# Patient Record
Sex: Female | Born: 1960 | Race: White | Hispanic: No | Smoking: Current every day smoker
Health system: Southern US, Community
[De-identification: ages and names within clinical notes are randomized; demographics above are authoritative.]

## PROBLEM LIST (undated history)

## (undated) DIAGNOSIS — Z982 Presence of cerebrospinal fluid drainage device: Secondary | ICD-10-CM

## (undated) DIAGNOSIS — R569 Unspecified convulsions: Secondary | ICD-10-CM

## (undated) DIAGNOSIS — S069X9A Unspecified intracranial injury with loss of consciousness of unspecified duration, initial encounter: Secondary | ICD-10-CM

## (undated) DIAGNOSIS — Z930 Tracheostomy status: Secondary | ICD-10-CM

## (undated) DIAGNOSIS — I639 Cerebral infarction, unspecified: Secondary | ICD-10-CM

## (undated) DIAGNOSIS — Z931 Gastrostomy status: Secondary | ICD-10-CM

## (undated) DIAGNOSIS — G919 Hydrocephalus, unspecified: Secondary | ICD-10-CM

## (undated) DIAGNOSIS — N390 Urinary tract infection, site not specified: Secondary | ICD-10-CM

## (undated) DIAGNOSIS — I480 Paroxysmal atrial fibrillation: Secondary | ICD-10-CM

---

## 2014-06-29 ENCOUNTER — Encounter (HOSPITAL_COMMUNITY): Payer: Self-pay | Admitting: Adult Health

## 2014-06-29 ENCOUNTER — Emergency Department (HOSPITAL_COMMUNITY): Payer: No Typology Code available for payment source

## 2014-06-29 ENCOUNTER — Inpatient Hospital Stay (HOSPITAL_COMMUNITY): Payer: No Typology Code available for payment source

## 2014-06-29 ENCOUNTER — Inpatient Hospital Stay (HOSPITAL_COMMUNITY)
Admission: EM | Admit: 2014-06-29 | Discharge: 2014-07-29 | DRG: 003 | Disposition: A | Discharge status: Rehabilitation Facility | Payer: No Typology Code available for payment source | Attending: General Surgery | Admitting: General Surgery

## 2014-06-29 DIAGNOSIS — S066X9A Traumatic subarachnoid hemorrhage with loss of consciousness of unspecified duration, initial encounter: Secondary | ICD-10-CM | POA: Diagnosis present

## 2014-06-29 DIAGNOSIS — B955 Unspecified streptococcus as the cause of diseases classified elsewhere: Secondary | ICD-10-CM | POA: Diagnosis not present

## 2014-06-29 DIAGNOSIS — Y95 Nosocomial condition: Secondary | ICD-10-CM | POA: Diagnosis not present

## 2014-06-29 DIAGNOSIS — R0603 Acute respiratory distress: Secondary | ICD-10-CM

## 2014-06-29 DIAGNOSIS — Z4659 Encounter for fitting and adjustment of other gastrointestinal appliance and device: Secondary | ICD-10-CM

## 2014-06-29 DIAGNOSIS — R339 Retention of urine, unspecified: Secondary | ICD-10-CM | POA: Diagnosis not present

## 2014-06-29 DIAGNOSIS — S069X9A Unspecified intracranial injury with loss of consciousness of unspecified duration, initial encounter: Principal | ICD-10-CM | POA: Diagnosis present

## 2014-06-29 DIAGNOSIS — S2241XA Multiple fractures of ribs, right side, initial encounter for closed fracture: Secondary | ICD-10-CM

## 2014-06-29 DIAGNOSIS — J982 Interstitial emphysema: Secondary | ICD-10-CM | POA: Diagnosis present

## 2014-06-29 DIAGNOSIS — S069XAA Unspecified intracranial injury with loss of consciousness status unknown, initial encounter: Secondary | ICD-10-CM | POA: Diagnosis present

## 2014-06-29 DIAGNOSIS — T1490XA Injury, unspecified, initial encounter: Secondary | ICD-10-CM

## 2014-06-29 DIAGNOSIS — S02402A Zygomatic fracture, unspecified, initial encounter for closed fracture: Secondary | ICD-10-CM | POA: Diagnosis present

## 2014-06-29 DIAGNOSIS — N390 Urinary tract infection, site not specified: Secondary | ICD-10-CM | POA: Diagnosis not present

## 2014-06-29 DIAGNOSIS — B9620 Unspecified Escherichia coli [E. coli] as the cause of diseases classified elsewhere: Secondary | ICD-10-CM | POA: Diagnosis not present

## 2014-06-29 DIAGNOSIS — S0291XA Unspecified fracture of skull, initial encounter for closed fracture: Secondary | ICD-10-CM | POA: Diagnosis present

## 2014-06-29 DIAGNOSIS — Y92488 Other paved roadways as the place of occurrence of the external cause: Secondary | ICD-10-CM | POA: Diagnosis not present

## 2014-06-29 DIAGNOSIS — Z6831 Body mass index (BMI) 31.0-31.9, adult: Secondary | ICD-10-CM | POA: Diagnosis not present

## 2014-06-29 DIAGNOSIS — J189 Pneumonia, unspecified organism: Secondary | ICD-10-CM | POA: Diagnosis not present

## 2014-06-29 DIAGNOSIS — Z9289 Personal history of other medical treatment: Secondary | ICD-10-CM

## 2014-06-29 DIAGNOSIS — I639 Cerebral infarction, unspecified: Secondary | ICD-10-CM | POA: Diagnosis not present

## 2014-06-29 DIAGNOSIS — S0292XA Unspecified fracture of facial bones, initial encounter for closed fracture: Secondary | ICD-10-CM | POA: Diagnosis present

## 2014-06-29 DIAGNOSIS — G40209 Localization-related (focal) (partial) symptomatic epilepsy and epileptic syndromes with complex partial seizures, not intractable, without status epilepticus: Secondary | ICD-10-CM | POA: Diagnosis present

## 2014-06-29 DIAGNOSIS — S60512A Abrasion of left hand, initial encounter: Secondary | ICD-10-CM | POA: Diagnosis present

## 2014-06-29 DIAGNOSIS — S12601A Unspecified nondisplaced fracture of seventh cervical vertebra, initial encounter for closed fracture: Secondary | ICD-10-CM | POA: Diagnosis present

## 2014-06-29 DIAGNOSIS — E669 Obesity, unspecified: Secondary | ICD-10-CM | POA: Diagnosis present

## 2014-06-29 DIAGNOSIS — G919 Hydrocephalus, unspecified: Secondary | ICD-10-CM

## 2014-06-29 DIAGNOSIS — S2243XA Multiple fractures of ribs, bilateral, initial encounter for closed fracture: Secondary | ICD-10-CM | POA: Diagnosis present

## 2014-06-29 DIAGNOSIS — S27322A Contusion of lung, bilateral, initial encounter: Secondary | ICD-10-CM | POA: Diagnosis present

## 2014-06-29 DIAGNOSIS — T814XXA Infection following a procedure, initial encounter: Secondary | ICD-10-CM | POA: Diagnosis not present

## 2014-06-29 DIAGNOSIS — E873 Alkalosis: Secondary | ICD-10-CM | POA: Diagnosis present

## 2014-06-29 DIAGNOSIS — S22019A Unspecified fracture of first thoracic vertebra, initial encounter for closed fracture: Secondary | ICD-10-CM | POA: Diagnosis present

## 2014-06-29 DIAGNOSIS — G910 Communicating hydrocephalus: Secondary | ICD-10-CM | POA: Diagnosis present

## 2014-06-29 DIAGNOSIS — D62 Acute posthemorrhagic anemia: Secondary | ICD-10-CM | POA: Diagnosis not present

## 2014-06-29 DIAGNOSIS — Z79891 Long term (current) use of opiate analgesic: Secondary | ICD-10-CM | POA: Diagnosis not present

## 2014-06-29 DIAGNOSIS — S064XAA Epidural hemorrhage with loss of consciousness status unknown, initial encounter: Secondary | ICD-10-CM

## 2014-06-29 DIAGNOSIS — J9811 Atelectasis: Secondary | ICD-10-CM | POA: Diagnosis present

## 2014-06-29 DIAGNOSIS — S022XXA Fracture of nasal bones, initial encounter for closed fracture: Secondary | ICD-10-CM | POA: Diagnosis present

## 2014-06-29 DIAGNOSIS — Z978 Presence of other specified devices: Secondary | ICD-10-CM

## 2014-06-29 DIAGNOSIS — S069X0A Unspecified intracranial injury without loss of consciousness, initial encounter: Secondary | ICD-10-CM

## 2014-06-29 DIAGNOSIS — R40243 Glasgow coma scale score 3-8: Secondary | ICD-10-CM | POA: Diagnosis present

## 2014-06-29 DIAGNOSIS — R4182 Altered mental status, unspecified: Secondary | ICD-10-CM

## 2014-06-29 DIAGNOSIS — F17210 Nicotine dependence, cigarettes, uncomplicated: Secondary | ICD-10-CM | POA: Diagnosis present

## 2014-06-29 DIAGNOSIS — J9600 Acute respiratory failure, unspecified whether with hypoxia or hypercapnia: Secondary | ICD-10-CM

## 2014-06-29 DIAGNOSIS — R001 Bradycardia, unspecified: Secondary | ICD-10-CM | POA: Diagnosis not present

## 2014-06-29 DIAGNOSIS — Z01818 Encounter for other preprocedural examination: Secondary | ICD-10-CM

## 2014-06-29 DIAGNOSIS — I609 Nontraumatic subarachnoid hemorrhage, unspecified: Secondary | ICD-10-CM | POA: Diagnosis present

## 2014-06-29 DIAGNOSIS — E1165 Type 2 diabetes mellitus with hyperglycemia: Secondary | ICD-10-CM | POA: Diagnosis present

## 2014-06-29 DIAGNOSIS — S0219XA Other fracture of base of skull, initial encounter for closed fracture: Secondary | ICD-10-CM | POA: Diagnosis present

## 2014-06-29 DIAGNOSIS — S06339A Contusion and laceration of cerebrum, unspecified, with loss of consciousness of unspecified duration, initial encounter: Secondary | ICD-10-CM | POA: Diagnosis present

## 2014-06-29 DIAGNOSIS — Z791 Long term (current) use of non-steroidal anti-inflammatories (NSAID): Secondary | ICD-10-CM

## 2014-06-29 DIAGNOSIS — J9690 Respiratory failure, unspecified, unspecified whether with hypoxia or hypercapnia: Secondary | ICD-10-CM

## 2014-06-29 DIAGNOSIS — S12600A Unspecified displaced fracture of seventh cervical vertebra, initial encounter for closed fracture: Secondary | ICD-10-CM | POA: Diagnosis present

## 2014-06-29 DIAGNOSIS — F10129 Alcohol abuse with intoxication, unspecified: Secondary | ICD-10-CM | POA: Diagnosis present

## 2014-06-29 DIAGNOSIS — Z930 Tracheostomy status: Secondary | ICD-10-CM

## 2014-06-29 DIAGNOSIS — S064X9A Epidural hemorrhage with loss of consciousness of unspecified duration, initial encounter: Secondary | ICD-10-CM

## 2014-06-29 DIAGNOSIS — S028XXA Fractures of other specified skull and facial bones, initial encounter for closed fracture: Secondary | ICD-10-CM | POA: Diagnosis present

## 2014-06-29 DIAGNOSIS — I480 Paroxysmal atrial fibrillation: Secondary | ICD-10-CM | POA: Diagnosis not present

## 2014-06-29 DIAGNOSIS — Z79899 Other long term (current) drug therapy: Secondary | ICD-10-CM

## 2014-06-29 DIAGNOSIS — R509 Fever, unspecified: Secondary | ICD-10-CM

## 2014-06-29 DIAGNOSIS — S50312A Abrasion of left elbow, initial encounter: Secondary | ICD-10-CM | POA: Diagnosis present

## 2014-06-29 DIAGNOSIS — S27329A Contusion of lung, unspecified, initial encounter: Secondary | ICD-10-CM

## 2014-06-29 DIAGNOSIS — S80212A Abrasion, left knee, initial encounter: Secondary | ICD-10-CM | POA: Diagnosis present

## 2014-06-29 DIAGNOSIS — R4020 Unspecified coma: Secondary | ICD-10-CM | POA: Diagnosis present

## 2014-06-29 LAB — I-STAT ARTERIAL BLOOD GAS, ED
BICARBONATE: 26 meq/L — AB (ref 20.0–24.0)
O2 Saturation: 100 %
PO2 ART: 192 mmHg — AB (ref 80.0–100.0)
TCO2: 27 mmol/L (ref 0–100)
pCO2 arterial: 46.4 mmHg — ABNORMAL HIGH (ref 35.0–45.0)
pH, Arterial: 7.357 (ref 7.350–7.450)

## 2014-06-29 LAB — CBC
HCT: 41.5 % (ref 36.0–46.0)
Hemoglobin: 14.1 g/dL (ref 12.0–15.0)
MCH: 31.8 pg (ref 26.0–34.0)
MCHC: 34 g/dL (ref 30.0–36.0)
MCV: 93.7 fL (ref 78.0–100.0)
Platelets: 216 10*3/uL (ref 150–400)
RBC: 4.43 MIL/uL (ref 3.87–5.11)
RDW: 13.8 % (ref 11.5–15.5)
WBC: 12.3 10*3/uL — AB (ref 4.0–10.5)

## 2014-06-29 LAB — URINE MICROSCOPIC-ADD ON

## 2014-06-29 LAB — I-STAT CHEM 8, ED
BUN: 14 mg/dL (ref 6–23)
CALCIUM ION: 1.16 mmol/L (ref 1.12–1.23)
Chloride: 100 mEq/L (ref 96–112)
Creatinine, Ser: 1.1 mg/dL (ref 0.50–1.10)
GLUCOSE: 160 mg/dL — AB (ref 70–99)
HEMATOCRIT: 45 % (ref 36.0–46.0)
Hemoglobin: 15.3 g/dL — ABNORMAL HIGH (ref 12.0–15.0)
Potassium: 3.7 mEq/L (ref 3.7–5.3)
Sodium: 137 mEq/L (ref 137–147)
TCO2: 25 mmol/L (ref 0–100)

## 2014-06-29 LAB — URINALYSIS, ROUTINE W REFLEX MICROSCOPIC
Bilirubin Urine: NEGATIVE
GLUCOSE, UA: NEGATIVE mg/dL
Ketones, ur: NEGATIVE mg/dL
Leukocytes, UA: NEGATIVE
Nitrite: NEGATIVE
Protein, ur: NEGATIVE mg/dL
Urobilinogen, UA: 0.2 mg/dL (ref 0.0–1.0)
pH: 5.5 (ref 5.0–8.0)

## 2014-06-29 LAB — COMPREHENSIVE METABOLIC PANEL
ALT: 31 U/L (ref 0–35)
AST: 26 U/L (ref 0–37)
Albumin: 3.9 g/dL (ref 3.5–5.2)
Alkaline Phosphatase: 87 U/L (ref 39–117)
Anion gap: 19 — ABNORMAL HIGH (ref 5–15)
BILIRUBIN TOTAL: 0.3 mg/dL (ref 0.3–1.2)
BUN: 13 mg/dL (ref 6–23)
CHLORIDE: 97 meq/L (ref 96–112)
CO2: 22 meq/L (ref 19–32)
CREATININE: 0.94 mg/dL (ref 0.50–1.10)
Calcium: 9.5 mg/dL (ref 8.4–10.5)
GFR calc Af Amer: 79 mL/min — ABNORMAL LOW (ref >90)
GFR calc non Af Amer: 69 mL/min — ABNORMAL LOW (ref >90)
Glucose, Bld: 161 mg/dL — ABNORMAL HIGH (ref 70–99)
Potassium: 4 mEq/L (ref 3.7–5.3)
Sodium: 138 mEq/L (ref 137–147)
Total Protein: 7.3 g/dL (ref 6.0–8.3)

## 2014-06-29 LAB — TYPE AND SCREEN
ABO/RH(D): O NEG
ANTIBODY SCREEN: NEGATIVE
UNIT DIVISION: 0
Unit division: 0

## 2014-06-29 LAB — CDS SEROLOGY

## 2014-06-29 LAB — ABO/RH: ABO/RH(D): O NEG

## 2014-06-29 LAB — I-STAT CG4 LACTIC ACID, ED: Lactic Acid, Venous: 2.45 mmol/L — ABNORMAL HIGH (ref 0.5–2.2)

## 2014-06-29 LAB — ETHANOL

## 2014-06-29 LAB — PROTIME-INR
INR: 1.06 (ref 0.00–1.49)
Prothrombin Time: 13.9 seconds (ref 11.6–15.2)

## 2014-06-29 LAB — CBG MONITORING, ED: Glucose-Capillary: 133 mg/dL — ABNORMAL HIGH (ref 70–99)

## 2014-06-29 LAB — POC URINE PREG, ED: Preg Test, Ur: NEGATIVE

## 2014-06-29 MED ORDER — POTASSIUM CHLORIDE IN NACL 20-0.9 MEQ/L-% IV SOLN
INTRAVENOUS | Status: AC
  Administered 2014-06-29 – 2014-06-30 (×3): via INTRAVENOUS
  Administered 2014-07-01: 1000 mL via INTRAVENOUS
  Administered 2014-07-02 – 2014-07-11 (×11): via INTRAVENOUS
  Filled 2014-06-29 (×28): qty 1000

## 2014-06-29 MED ORDER — SUCCINYLCHOLINE CHLORIDE 20 MG/ML IJ SOLN
INTRAMUSCULAR | Status: AC | PRN
  Administered 2014-06-29: 100 mg via INTRAVENOUS

## 2014-06-29 MED ORDER — CETYLPYRIDINIUM CHLORIDE 0.05 % MT LIQD
7.0000 mL | Freq: Four times a day (QID) | OROMUCOSAL | Status: DC
  Administered 2014-06-29 – 2014-07-29 (×112): 7 mL via OROMUCOSAL

## 2014-06-29 MED ORDER — PANTOPRAZOLE SODIUM 40 MG IV SOLR
40.0000 mg | Freq: Every day | INTRAVENOUS | Status: DC
  Administered 2014-06-29 – 2014-07-17 (×18): 40 mg via INTRAVENOUS
  Filled 2014-06-29 (×21): qty 40

## 2014-06-29 MED ORDER — CHLORHEXIDINE GLUCONATE 0.12 % MT SOLN
15.0000 mL | Freq: Two times a day (BID) | OROMUCOSAL | Status: DC
  Administered 2014-06-29 – 2014-07-28 (×56): 15 mL via OROMUCOSAL
  Filled 2014-06-29 (×55): qty 15

## 2014-06-29 MED ORDER — PROPOFOL 10 MG/ML IV EMUL
INTRAVENOUS | Status: AC
  Filled 2014-06-29: qty 100

## 2014-06-29 MED ORDER — PANTOPRAZOLE SODIUM 40 MG PO TBEC: 40.0000 mg | DELAYED_RELEASE_TABLET | Freq: Every day | ORAL | Status: DC

## 2014-06-29 MED ORDER — SUCCINYLCHOLINE CHLORIDE 20 MG/ML IJ SOLN
INTRAMUSCULAR | Status: AC
  Filled 2014-06-29: qty 1

## 2014-06-29 MED ORDER — LIDOCAINE HCL (CARDIAC) 20 MG/ML IV SOLN
INTRAVENOUS | Status: AC
Start: 2014-06-29 — End: 2014-06-30
  Filled 2014-06-29: qty 5

## 2014-06-29 MED ORDER — ONDANSETRON HCL 4 MG/2ML IJ SOLN
4.0000 mg | Freq: Four times a day (QID) | INTRAMUSCULAR | Status: DC | PRN
  Administered 2014-07-04: 4 mg via INTRAVENOUS
  Filled 2014-06-29 (×2): qty 2

## 2014-06-29 MED ORDER — BISACODYL 10 MG RE SUPP: 10.0000 mg | Freq: Every day | RECTAL | Status: DC | PRN

## 2014-06-29 MED ORDER — TETANUS-DIPHTH-ACELL PERTUSSIS 5-2.5-18.5 LF-MCG/0.5 IM SUSP
0.5000 mL | Freq: Once | INTRAMUSCULAR | Status: AC
  Administered 2014-06-29: 0.5 mL via INTRAMUSCULAR
  Filled 2014-06-29: qty 0.5

## 2014-06-29 MED ORDER — SODIUM CHLORIDE 0.9 % IV SOLN
20.0000 ug/h | INTRAVENOUS | Status: DC
  Administered 2014-06-29: 50 ug/h via INTRAVENOUS
  Administered 2014-06-30 – 2014-07-01 (×2): 100 ug/h via INTRAVENOUS
  Administered 2014-07-02: 50 ug/h via INTRAVENOUS
  Administered 2014-07-03 – 2014-07-04 (×2): 100 ug/h via INTRAVENOUS
  Administered 2014-07-05: 150 ug/h via INTRAVENOUS
  Administered 2014-07-06: 175 ug/h via INTRAVENOUS
  Administered 2014-07-06: 100 ug/h via INTRAVENOUS
  Administered 2014-07-09: 50 ug/h via INTRAVENOUS
  Filled 2014-06-29 (×11): qty 50

## 2014-06-29 MED ORDER — SODIUM CHLORIDE 0.9 % IV SOLN
Freq: Once | INTRAVENOUS | Status: AC
  Administered 2014-06-29: 18:00:00 via INTRAVENOUS

## 2014-06-29 MED ORDER — ONDANSETRON HCL 4 MG PO TABS: 4.0000 mg | ORAL_TABLET | Freq: Four times a day (QID) | ORAL | Status: DC | PRN

## 2014-06-29 MED ORDER — PROPOFOL 10 MG/ML IV EMUL
INTRAVENOUS | Status: AC
  Administered 2014-06-29: 25 ug/kg/min via INTRAVENOUS
  Filled 2014-06-29: qty 100

## 2014-06-29 MED ORDER — PROPOFOL 10 MG/ML IV EMUL
5.0000 ug/kg/min | INTRAVENOUS | Status: DC
  Administered 2014-06-29: 25 ug/kg/min via INTRAVENOUS
  Administered 2014-06-30 (×2): 15 ug/kg/min via INTRAVENOUS
  Administered 2014-07-01 (×2): 20 ug/kg/min via INTRAVENOUS
  Administered 2014-07-01: 30 ug/kg/min via INTRAVENOUS
  Administered 2014-07-01: 20 ug/kg/min via INTRAVENOUS
  Administered 2014-07-02: 15.03 ug/kg/min via INTRAVENOUS
  Administered 2014-07-02: 20 ug/kg/min via INTRAVENOUS
  Administered 2014-07-02: 30 ug/kg/min via INTRAVENOUS
  Administered 2014-07-03 (×2): 20 ug/kg/min via INTRAVENOUS
  Filled 2014-06-29 (×10): qty 100

## 2014-06-29 MED ORDER — PROPOFOL 10 MG/ML IV EMUL
5.0000 ug/kg/min | Freq: Once | INTRAVENOUS | Status: AC
  Administered 2014-06-29: 30 ug/kg/min via INTRAVENOUS
  Administered 2014-06-29: 80 ug/kg/min via INTRAVENOUS

## 2014-06-29 MED ORDER — IOHEXOL 300 MG/ML  SOLN
100.0000 mL | Freq: Once | INTRAMUSCULAR | Status: AC | PRN
  Administered 2014-06-29: 100 mL via INTRAVENOUS

## 2014-06-29 MED ORDER — ETOMIDATE 2 MG/ML IV SOLN
INTRAVENOUS | Status: AC | PRN
  Administered 2014-06-29: 40 mg via INTRAVENOUS

## 2014-06-29 MED ORDER — ETOMIDATE 2 MG/ML IV SOLN
INTRAVENOUS | Status: AC
  Filled 2014-06-29: qty 20

## 2014-06-29 MED ORDER — ROCURONIUM BROMIDE 50 MG/5ML IV SOLN
INTRAVENOUS | Status: AC
  Filled 2014-06-29: qty 2
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
## 2014-06-29 NOTE — ED Notes (Signed)
Pt taken to CT with RN accompanyment
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
## 2014-06-29 NOTE — ED Notes (Signed)
Dr. Jenkins at bedside. 

## 2014-06-29 NOTE — ED Notes (Signed)
Dr Emeline DarlingGore at bedside to reduce nasal fracture.
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
## 2014-06-29 NOTE — Consult Note (Signed)
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 has dried blood coming from her nose. Her left external auditory canal is full of blood. Her right tympanic membrane is clear.  Neck: Supple without obvious deformities  Thorax: Symmetric  Abdomen: Obese and soft  Extremities: Unremarkable  Neck exam: Unremarkable  Neurologic exam: The patient's Glasgow Coma Scale 10 intubated, [id number]. Her pupils are as above. She has a cough and gag reflex. She  is moving all 4 extremities well. She will follow simple commands when not sedated.  I reviewed the patient's head CT performed at [facility] today. She has a left epidural hematoma, left parietal skull fracture, small right subdural hematoma, diffuse subarachnoid hemorrhage. There is no midline shift.  I have also reviewed the patient's cervical CT performed at [facility] today. The patient has a left C7-T1 facet fracture which is nondisplaced.  I have also reviewed the patient's CT of her chest, abdomen, and pelvis only as it pertains to her spine. I don't see any fractures below the C7-T1 facet fracture.  Assessment/Plan: Traumatic brain injury, epidural hematoma, subdural  hematoma, traumatic subarachnoid hemorrhage, skull fracture: The patient at this point is Glasgow Coma Scale 10 and following commands, moving all 4 extremities. She does not need surgery presently. I would recommend minimizing the sedation and following her clinical exam. She does not need a intracranial pressure monitor presently. I think it would behoove the patient from a pulmonary and neurologic status to sit up to 30. We will plan to repeat her CAT scan tomorrow.  C7-T1 facet fracture: This should heal in a collar.  Multiple systemic injuries : noted  Montrel Donahoe D 06/29/2014, 7:31 PM

## 2014-06-30 ENCOUNTER — Inpatient Hospital Stay (HOSPITAL_COMMUNITY): Payer: No Typology Code available for payment source

## 2014-06-30 ENCOUNTER — Encounter (HOSPITAL_COMMUNITY): Payer: Self-pay | Admitting: Radiology

## 2014-06-30 LAB — BASIC METABOLIC PANEL
ANION GAP: 16 — AB (ref 5–15)
BUN: 9 mg/dL (ref 6–23)
CALCIUM: 9.1 mg/dL (ref 8.4–10.5)
CO2: 21 mEq/L (ref 19–32)
Chloride: 98 mEq/L (ref 96–112)
Creatinine, Ser: 0.64 mg/dL (ref 0.50–1.10)
GFR calc non Af Amer: >90 mL/min (ref >90)
Glucose, Bld: 146 mg/dL — ABNORMAL HIGH (ref 70–99)
Potassium: 4.5 mEq/L (ref 3.7–5.3)
Sodium: 135 mEq/L — ABNORMAL LOW (ref 137–147)

## 2014-06-30 LAB — CBC
HCT: 40.2 % (ref 36.0–46.0)
Hemoglobin: 13.7 g/dL (ref 12.0–15.0)
MCH: 32.5 pg (ref 26.0–34.0)
MCHC: 34.1 g/dL (ref 30.0–36.0)
MCV: 95.5 fL (ref 78.0–100.0)
PLATELETS: 207 10*3/uL (ref 150–400)
RBC: 4.21 MIL/uL (ref 3.87–5.11)
RDW: 14 % (ref 11.5–15.5)
WBC: 13.1 10*3/uL — ABNORMAL HIGH (ref 4.0–10.5)

## 2014-06-30 LAB — BLOOD GAS, ARTERIAL
Acid-Base Excess: 0.3 mmol/L (ref 0.0–2.0)
Bicarbonate: 25.2 mEq/L — ABNORMAL HIGH (ref 20.0–24.0)
DRAWN BY: 27733
FIO2: 0.4 %
MODE: POSITIVE
O2 Saturation: 97.5 %
PCO2 ART: 48.1 mmHg — AB (ref 35.0–45.0)
PEEP/CPAP: 5 cmH2O
PH ART: 7.343 — AB (ref 7.350–7.450)
PO2 ART: 109 mmHg — AB (ref 80.0–100.0)
PRESSURE SUPPORT: 8 cmH2O
Patient temperature: 100
TCO2: 26.6 mmol/L (ref 0–100)

## 2014-06-30 LAB — GLUCOSE, CAPILLARY
GLUCOSE-CAPILLARY: 144 mg/dL — AB (ref 70–99)
GLUCOSE-CAPILLARY: 143 mg/dL — AB (ref 70–99)
Glucose-Capillary: 141 mg/dL — ABNORMAL HIGH (ref 70–99)

## 2014-06-30 LAB — TRIGLYCERIDES: TRIGLYCERIDES: 170 mg/dL — AB (ref <150)

## 2014-06-30 MED ORDER — PIVOT 1.5 CAL PO LIQD
1000.0000 mL | ORAL | Status: DC
  Administered 2014-06-30: 1000 mL
  Filled 2014-06-30 (×3): qty 1000

## 2014-06-30 MED ORDER — SELENIUM 50 MCG PO TABS
200.0000 ug | ORAL_TABLET | Freq: Every day | ORAL | Status: AC
  Administered 2014-06-30 – 2014-07-06 (×7): 200 ug
  Filled 2014-06-30 (×8): qty 4

## 2014-06-30 MED ORDER — IPRATROPIUM-ALBUTEROL 0.5-2.5 (3) MG/3ML IN SOLN
3.0000 mL | Freq: Four times a day (QID) | RESPIRATORY_TRACT | Status: DC
  Administered 2014-06-30 – 2014-07-04 (×19): 3 mL via RESPIRATORY_TRACT
  Filled 2014-06-30 (×19): qty 3

## 2014-06-30 MED ORDER — ACETAMINOPHEN 650 MG RE SUPP
650.0000 mg | RECTAL | Status: DC | PRN
  Administered 2014-07-01 – 2014-07-14 (×13): 650 mg via RECTAL
  Filled 2014-06-30 (×15): qty 1

## 2014-06-30 MED ORDER — VITAMIN C 500 MG PO TABS
1000.0000 mg | ORAL_TABLET | Freq: Three times a day (TID) | ORAL | Status: AC
  Administered 2014-06-30 – 2014-07-06 (×19): 1000 mg
  Filled 2014-06-30 (×21): qty 2
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
## 2014-06-30 NOTE — Progress Notes (Signed)
   06/30/14 0200  Clinical Encounter Type  Visited With Family  Visit Type Follow-up   Chaplain ran into patient's sister in the hallway. Chaplain helped patient's sister find [facility] ICU. Chaplain introduced patient's sister to patient's current nurse. Nurse is now informing patient's sister of the patient's current condition. Page Merrilyn Puman-Call chaplain if further support is needed. Shareece Bultman, Tommi EmeryBlake R, Chaplain  2:06 AM
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
## 2014-07-01 ENCOUNTER — Inpatient Hospital Stay (HOSPITAL_COMMUNITY): Payer: No Typology Code available for payment source

## 2014-07-01 LAB — GLUCOSE, CAPILLARY
Glucose-Capillary: 138 mg/dL — ABNORMAL HIGH (ref 70–99)
Glucose-Capillary: 141 mg/dL — ABNORMAL HIGH (ref 70–99)
Glucose-Capillary: 145 mg/dL — ABNORMAL HIGH (ref 70–99)
Glucose-Capillary: 147 mg/dL — ABNORMAL HIGH (ref 70–99)
Glucose-Capillary: 158 mg/dL — ABNORMAL HIGH (ref 70–99)
Glucose-Capillary: 191 mg/dL — ABNORMAL HIGH (ref 70–99)

## 2014-07-01 LAB — BASIC METABOLIC PANEL
Anion gap: 11 (ref 5–15)
BUN: 10 mg/dL (ref 6–23)
CALCIUM: 8.6 mg/dL (ref 8.4–10.5)
CO2: 26 mEq/L (ref 19–32)
Chloride: 101 mEq/L (ref 96–112)
Creatinine, Ser: 0.61 mg/dL (ref 0.50–1.10)
Glucose, Bld: 132 mg/dL — ABNORMAL HIGH (ref 70–99)
POTASSIUM: 4.9 meq/L (ref 3.7–5.3)
SODIUM: 138 meq/L (ref 137–147)

## 2014-07-01 LAB — PREPARE FRESH FROZEN PLASMA
Unit division: 0
Unit division: 0

## 2014-07-01 LAB — BLOOD GAS, ARTERIAL
Acid-Base Excess: 2.9 mmol/L — ABNORMAL HIGH (ref 0.0–2.0)
Acid-Base Excess: 3.4 mmol/L — ABNORMAL HIGH (ref 0.0–2.0)
Bicarbonate: 28 mEq/L — ABNORMAL HIGH (ref 20.0–24.0)
Bicarbonate: 27.7 mEq/L — ABNORMAL HIGH (ref 20.0–24.0)
DRAWN BY: 283401
DRAWN BY: 249101
FIO2: 0.4 %
FIO2: 0.4 %
LHR: 16 {breaths}/min
MECHVT: 460 mL
O2 Saturation: 94.6 %
O2 Saturation: 97.7 %
PCO2 ART: 52.9 mmHg — AB (ref 35.0–45.0)
PCO2 ART: 46.4 mmHg — AB (ref 35.0–45.0)
PEEP/CPAP: 5 cmH2O
PEEP: 5 cmH2O
PH ART: 7.347 — AB (ref 7.350–7.450)
PO2 ART: 77.5 mmHg — AB (ref 80.0–100.0)
Patient temperature: 99.7
Patient temperature: 100.4
RATE: 20 resp/min
TCO2: 29.6 mmol/L (ref 0–100)
TCO2: 29 mmol/L (ref 0–100)
VT: 460 mL
pH, Arterial: 7.399 (ref 7.350–7.450)
pO2, Arterial: 105 mmHg — ABNORMAL HIGH (ref 80.0–100.0)

## 2014-07-01 LAB — CBC
HCT: 35 % — ABNORMAL LOW (ref 36.0–46.0)
Hemoglobin: 11.6 g/dL — ABNORMAL LOW (ref 12.0–15.0)
MCH: 32.3 pg (ref 26.0–34.0)
MCHC: 33.1 g/dL (ref 30.0–36.0)
MCV: 97.5 fL (ref 78.0–100.0)
PLATELETS: 197 10*3/uL (ref 150–400)
RBC: 3.59 MIL/uL — AB (ref 3.87–5.11)
RDW: 14.2 % (ref 11.5–15.5)
WBC: 8.6 10*3/uL (ref 4.0–10.5)

## 2014-07-01 LAB — BLOOD PRODUCT ORDER (VERBAL) VERIFICATION

## 2014-07-01 MED ORDER — PRO-STAT SUGAR FREE PO LIQD
60.0000 mL | Freq: Four times a day (QID) | ORAL | Status: DC
  Administered 2014-07-01 – 2014-07-05 (×20): 60 mL
  Filled 2014-07-01 (×29): qty 60

## 2014-07-01 MED ORDER — PIVOT 1.5 CAL PO LIQD
1000.0000 mL | ORAL | Status: DC
  Administered 2014-07-01 – 2014-07-05 (×5): 1000 mL
  Filled 2014-07-01 (×8): qty 1000

## 2014-07-01 MED ORDER — SODIUM CHLORIDE 0.9 % IV SOLN: INTRAVENOUS | Status: DC | PRN

## 2014-07-01 MED ORDER — ADULT MULTIVITAMIN LIQUID CH
5.0000 mL | Freq: Every day | ORAL | Status: DC
  Administered 2014-07-01 – 2014-07-06 (×6): 5 mL
  Filled 2014-07-01 (×9): qty 5

## 2014-07-01 NOTE — Progress Notes (Signed)
UR completed.  Vash Quezada, RN BSN MHA CCM Trauma/Neuro ICU Case Manager [phone]  

## 2014-07-01 NOTE — Progress Notes (Signed)
Pt has had 2 incidents where heart rate drops into the upper 40's. First occurred after bringing head of bed up and readjusting her position. Event lasted a min or two. Second incident occurred when repositioning pt at 4 am. This occurrence took about 20 min to resolve. Pt never lost her P waves and blood pressure remained stable at all times.
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
## 2014-07-02 ENCOUNTER — Telehealth (HOSPITAL_COMMUNITY): Payer: Self-pay

## 2014-07-02 LAB — BLOOD GAS, ARTERIAL
ACID-BASE EXCESS: 3.9 mmol/L — AB (ref 0.0–2.0)
Bicarbonate: 27.8 mEq/L — ABNORMAL HIGH (ref 20.0–24.0)
DRAWN BY: 40415
FIO2: 0.4 %
LHR: 22 {breaths}/min
MECHVT: 460 mL
O2 SAT: 98.2 %
PEEP: 5 cmH2O
Patient temperature: 100
TCO2: 29.1 mmol/L (ref 0–100)
pCO2 arterial: 42.8 mmHg (ref 35.0–45.0)
pH, Arterial: 7.432 (ref 7.350–7.450)
pO2, Arterial: 100 mmHg (ref 80.0–100.0)

## 2014-07-02 LAB — BASIC METABOLIC PANEL
Anion gap: 12 (ref 5–15)
BUN: 16 mg/dL (ref 6–23)
CALCIUM: 8.4 mg/dL (ref 8.4–10.5)
CO2: 24 meq/L (ref 19–32)
Chloride: 101 mEq/L (ref 96–112)
Creatinine, Ser: 0.55 mg/dL (ref 0.50–1.10)
GFR calc Af Amer: >90 mL/min (ref >90)
GLUCOSE: 128 mg/dL — AB (ref 70–99)
Potassium: 4.9 mEq/L (ref 3.7–5.3)
Sodium: 137 mEq/L (ref 137–147)

## 2014-07-02 LAB — CBC
HCT: 30.5 % — ABNORMAL LOW (ref 36.0–46.0)
Hemoglobin: 10.3 g/dL — ABNORMAL LOW (ref 12.0–15.0)
MCH: 32.9 pg (ref 26.0–34.0)
MCHC: 33.8 g/dL (ref 30.0–36.0)
MCV: 97.4 fL (ref 78.0–100.0)
Platelets: 183 10*3/uL (ref 150–400)
RBC: 3.13 MIL/uL — AB (ref 3.87–5.11)
RDW: 14.6 % (ref 11.5–15.5)
WBC: 10 10*3/uL (ref 4.0–10.5)

## 2014-07-02 LAB — GLUCOSE, CAPILLARY
GLUCOSE-CAPILLARY: 133 mg/dL — AB (ref 70–99)
GLUCOSE-CAPILLARY: 176 mg/dL — AB (ref 70–99)
Glucose-Capillary: 148 mg/dL — ABNORMAL HIGH (ref 70–99)
Glucose-Capillary: 133 mg/dL — ABNORMAL HIGH (ref 70–99)
Glucose-Capillary: 139 mg/dL — ABNORMAL HIGH (ref 70–99)

## 2014-07-02 MED ORDER — GUAIFENESIN 100 MG/5ML PO SYRP
400.0000 mg | ORAL_SOLUTION | ORAL | Status: DC
  Administered 2014-07-02 – 2014-07-06 (×24): 400 mg via ORAL
  Filled 2014-07-02 (×49): qty 20

## 2014-07-02 MED ORDER — AMANTADINE HCL 50 MG/5ML PO SYRP
100.0000 mg | ORAL_SOLUTION | Freq: Two times a day (BID) | ORAL | Status: DC
  Administered 2014-07-02 – 2014-07-06 (×9): 100 mg via ORAL
  Filled 2014-07-02 (×16): qty 10
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
## 2014-07-02 NOTE — Progress Notes (Signed)
Patient ID: [name] LauberRhonda Giampietro, female   DOB: 08/27/1960, 53 y.o.   MRN: [id number] Subjective:  The patient is somnolent and sedated. She is in no apparent distress.  Objective: Vital signs in last 24 hours: Temp:  [99.5 F (37.5 C)-101.5 F (38.6 C)] 99.9 F (37.7 C) (11/24 0700) Pulse Rate:  [65-89] 78 (11/24 0724) Resp:  [14-22] 22 (11/24 0724) BP: (111-134)/(54-72) 124/66 mmHg (11/24 0724) SpO2:  [91 %-100 %] 99 % (11/24 0724) Arterial Line BP: (119-145)/(50-66) 136/63 mmHg (11/24 0700) FiO2 (%):  [40 %] 40 % (11/24 0724) Weight:  [99.2 kg (218 lb 11.1 oz)] 99.2 kg (218 lb 11.1 oz) (11/24 0500)  Intake/Output from previous day: 11/23 0701 - 11/24 0700 In: 3161.9 [I.V.:2691.9; NG/GT:470] Out: 1675 [Urine:1675] Intake/Output this shift:    Physical exam the patient's Glasgow Coma Scale 10 intubated, E3M6V1. The patient localizes bilaterally. Her pupils are equal.  Lab Results:  Recent Labs  07/01/14 0303 07/02/14 0512  WBC 8.6 10.0  HGB 11.6* 10.3*  HCT 35.0* 30.5*  PLT 197 183   BMET  Recent Labs  07/01/14 0303 07/02/14 0238  NA 138 137  K 4.9 4.9  CL 101 101  CO2 26 24  GLUCOSE 132* 128*  BUN 10 16  CREATININE 0.61 0.55  CALCIUM 8.6 8.4    Studies/Results: Dg Chest Port 1 View  07/01/2014   CLINICAL DATA:  Respiratory failure  EXAM: PORTABLE CHEST - 1 VIEW  COMPARISON:  Portable chest x-ray of June 30, 2014  FINDINGS: The lungs are adequately inflated. There has been interval improvement in the appearance of the right upper lobe atelectasis. There remain coarse lung markings at the left lung base. There is no significant pleural effusion. The cardiac silhouette is mildly enlarged but stable. The pulmonary vascularity is less prominent. The endotracheal tube tip projects 5.2 cm above the crotch of the carina. The esophagogastric tube tip projects below the inferior margin of the image.  IMPRESSION: There has been slight interval improvement in the appearance  of the right upper lobe atelectatic process. There is persistent subsegmental atelectasis at the left lung base. Low-grade CHF has improved.   Electronically Signed   By: David  [place]   On: 07/01/2014 07:59   Dg Abd Portable 1v  06/30/2014   CLINICAL DATA:  Orogastric tube placement.  EXAM: PORTABLE ABDOMEN - 1 VIEW  COMPARISON:  None.  FINDINGS: Exam demonstrates patient's enteric tube coiled once over the stomach with tip right of midline in the mid abdomen likely over the distal stomach. Bowel gas pattern is nonobstructive. Remainder of the exam is unremarkable.  IMPRESSION: Nonobstructive bowel gas pattern.  Enteric tube coiled once over the stomach with tip over the region of the distal stomach just right of midline in the mid abdomen.   Electronically Signed   By: Elberta Fortisaniel  Boyle M.D.   On: 06/30/2014 11:25    Assessment/Plan: Traumatic brain injury, subdural hematoma: The patient is neurologically stable.  LOS: 3 days     Austan Nicholl D 07/02/2014, 7:46 AM
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
## 2014-07-03 ENCOUNTER — Inpatient Hospital Stay (HOSPITAL_COMMUNITY): Payer: No Typology Code available for payment source

## 2014-07-03 LAB — GLUCOSE, CAPILLARY
GLUCOSE-CAPILLARY: 146 mg/dL — AB (ref 70–99)
GLUCOSE-CAPILLARY: 120 mg/dL — AB (ref 70–99)
GLUCOSE-CAPILLARY: 157 mg/dL — AB (ref 70–99)
GLUCOSE-CAPILLARY: 149 mg/dL — AB (ref 70–99)
Glucose-Capillary: 141 mg/dL — ABNORMAL HIGH (ref 70–99)
Glucose-Capillary: 149 mg/dL — ABNORMAL HIGH (ref 70–99)
Glucose-Capillary: 194 mg/dL — ABNORMAL HIGH (ref 70–99)

## 2014-07-03 LAB — CBC
HEMATOCRIT: 30 % — AB (ref 36.0–46.0)
Hemoglobin: 9.8 g/dL — ABNORMAL LOW (ref 12.0–15.0)
MCH: 31.2 pg (ref 26.0–34.0)
MCHC: 32.7 g/dL (ref 30.0–36.0)
MCV: 95.5 fL (ref 78.0–100.0)
PLATELETS: 219 10*3/uL (ref 150–400)
RBC: 3.14 MIL/uL — AB (ref 3.87–5.11)
RDW: 14.4 % (ref 11.5–15.5)
WBC: 8.7 10*3/uL (ref 4.0–10.5)

## 2014-07-03 LAB — TRIGLYCERIDES: Triglycerides: 126 mg/dL (ref <150)

## 2014-07-03 MED ORDER — DEXMEDETOMIDINE HCL IN NACL 200 MCG/50ML IV SOLN
0.0000 ug/kg/h | INTRAVENOUS | Status: AC
  Administered 2014-07-03 (×2): 0.2 ug/kg/h via INTRAVENOUS
  Administered 2014-07-04: 0.5 ug/kg/h via INTRAVENOUS
  Administered 2014-07-04: 0.6 ug/kg/h via INTRAVENOUS
  Administered 2014-07-04: 0.2 ug/kg/h via INTRAVENOUS
  Administered 2014-07-04: 0.6 ug/kg/h via INTRAVENOUS
  Administered 2014-07-05: 0.4 ug/kg/h via INTRAVENOUS
  Administered 2014-07-05 – 2014-07-06 (×3): 0.3 ug/kg/h via INTRAVENOUS
  Administered 2014-07-06: 0.5 ug/kg/h via INTRAVENOUS
  Filled 2014-07-03 (×13): qty 50

## 2014-07-03 NOTE — Clinical Social Work Note (Signed)
Clinical Social Worker attempted to meet with patient family at bedside to offer support.  No family currently at bedside and patient remains intubated at this time with no plans for extubation.  CSW remains available for support and to complete full assessment with patient and/or family once available.  Macario GoldsJesse Keiland [place]ering, [place] [phone]

## 2014-07-03 NOTE — Progress Notes (Signed)
Attempted wean this morning pt became aggitated and HR dropped down to low 40's. Wean stopped and placed pt back on full support.

## 2014-07-03 NOTE — Plan of Care (Signed)
Problem: Phase I Progression Outcomes Goal: Pain controlled with appropriate interventions Outcome: Progressing Goal: Hemodynamically stable Outcome: Progressing     

## 2014-07-03 NOTE — Progress Notes (Signed)
Chaplain initiated visit with pt and family. Chaplain introduced herself to pt niece and great niece. Pt family verbalized appreciation that she is "still with us." And asked for medical update. Chaplain facilitated communication between pt and medical staff.   07/03/14 1000  Clinical Encounter Type  Visited With Family  Visit Type Initial;Spiritual support  Jiles HaroldStamey, Reanna Scoggin F, Chaplain 07/03/2014 10:58 AM

## 2014-07-03 NOTE — Progress Notes (Signed)
Follow up - Trauma and Critical Care  Patient Details:    [name] [name] is an 53 y.o. female.  Lines/tubes : Airway 7.5 mm (Active)  Secured at (cm) 21 cm 07/03/2014  8:24 AM  Measured From Lips 07/03/2014  8:24 AM  Secured Location Left 07/03/2014  8:24 AM  Secured By [employer] Tube Holder 07/03/2014  8:24 AM  Tube Holder Repositioned Yes 07/03/2014  8:24 AM  Cuff Pressure (cm H2O) 24 cm H2O 07/03/2014  3:11 AM  Site Condition Dry 07/03/2014  8:24 AM     Arterial Line 07/02/14 Left Radial (Active)  Site Assessment Clean;Dry;Intact 07/03/2014  8:00 AM  Line Status Pulsatile blood flow 07/03/2014  8:00 AM  Art Line Waveform Appropriate;Square wave test performed 07/03/2014  8:00 AM  Art Line Interventions Zeroed and calibrated;Leveled 07/03/2014  8:00 AM  Color/Movement/Sensation Capillary refill less than 3 sec 07/03/2014  8:00 AM  Dressing Type Transparent;Occlusive 07/03/2014  8:00 AM  Dressing Status Clean;Dry;Intact 07/03/2014  8:00 AM     NG/OG Tube Orogastric 16 Fr. Center mouth (Active)  Placement Verification Auscultation 07/03/2014  8:00 AM  Site Assessment Clean;Dry;Intact 07/03/2014  8:00 AM  Status Infusing tube feed 07/03/2014  8:00 AM  Drainage Appearance Tan 07/02/2014  8:00 AM  Gastric Residual 0 mL 07/03/2014  8:00 AM  Intake (mL) 200 mL 07/03/2014 11:00 AM     Urethral Catheter Marylene LandAngela T, RN Temperature probe 16 Fr. (Active)  Indication for Insertion or Continuance of Catheter Unstable critical patients (first 24-48 hours) 07/03/2014  8:00 AM  Site Assessment Clean;Intact 07/03/2014  8:00 AM  Catheter Maintenance Bag below level of bladder;Catheter secured;Drainage bag/tubing not touching floor;No dependent loops;Seal intact 07/03/2014  8:00 AM  Collection Container Standard drainage bag 07/03/2014  8:00 AM  Securement Method Leg strap 07/03/2014  8:00 AM  Urinary Catheter Interventions Unclamped 07/03/2014  8:00 AM  Output (mL) 200 mL 07/03/2014 11:00 AM     Microbiology/Sepsis markers: No results found for this or any previous visit.  Anti-infectives:  Anti-infectives    None      Best Practice/Protocols:  VTE Prophylaxis: Mechanical GI Prophylaxis: Proton Pump Inhibitor Continous Sedation  Consults: Treatment Team:  Tressie StalkerJeffrey Jenkins, MD    Events:  Subjective:    Overnight Issues: Whenever attempts are made to wean this patient, she quickly becomes very bradycardic.  Also, she get bradycardic with severe agitation.  On Fentany and propofol  Objective:  Vital signs for last 24 hours: Temp:  [99.3 F (37.4 C)-100.8 F (38.2 C)] 100.4 F (38 C) (11/25 1200) Pulse Rate:  [65-99] 69 (11/25 1200) Resp:  [18-25] 22 (11/25 1200) BP: (99-151)/(57-83) 121/63 mmHg (11/25 1200) SpO2:  [93 %-100 %] 99 % (11/25 1200) Arterial Line BP: (95-178)/(55-91) 150/64 mmHg (11/25 1200) FiO2 (%):  [40 %] 40 % (11/25 1200) Weight:  [97.3 kg (214 lb 8.1 oz)] 97.3 kg (214 lb 8.1 oz) (11/25 0425)  Hemodynamic parameters for last 24 hours:    Intake/Output from previous day: 11/24 0701 - 11/25 0700 In: 3120.8 [I.V.:2610.8; NG/GT:510] Out: 3950 [Urine:3950]  Intake/Output this shift: Total I/O In: 704.8 [I.V.:454.8; NG/GT:250] Out: 330 [Urine:330]  Vent settings for last 24 hours: Vent Mode:  [-] PRVC FiO2 (%):  [40 %] 40 % Set Rate:  [2 bmp-22 bmp] 2 bmp Vt Set:  [460 mL] 460 mL PEEP:  [5 cmH20] 5 cmH20 Plateau Pressure:  [17 cmH20-18 cmH20] 17 cmH20  Physical Exam:  General: sedated and not responding very much at all.  Neuro: RASS -1 Resp: clear to auscultation bilaterally CVS: regular rate and rhythm, S1, S2 normal, no murmur, click, rub or gallop, brady and gets bradycardic with stimulation and weaning Extremities: no edema, no erythema, pulses WNL  Results for orders placed or performed during the hospital encounter of 06/29/14 (from the past 24 hour(s))  Glucose, capillary     Status: Abnormal   Collection Time:  07/02/14  4:14 PM  Result Value Ref Range   Glucose-Capillary 139 (H) 70 - 99 mg/dL   Comment 1 Notify RN    Comment 2 Documented in Chart   Glucose, capillary     Status: Abnormal   Collection Time: 07/02/14  7:58 PM  Result Value Ref Range   Glucose-Capillary 141 (H) 70 - 99 mg/dL  Glucose, capillary     Status: Abnormal   Collection Time: 07/02/14 11:45 PM  Result Value Ref Range   Glucose-Capillary 149 (H) 70 - 99 mg/dL  Glucose, capillary     Status: Abnormal   Collection Time: 07/03/14  4:23 AM  Result Value Ref Range   Glucose-Capillary 146 (H) 70 - 99 mg/dL  Triglycerides     Status: None   Collection Time: 07/03/14  6:45 AM  Result Value Ref Range   Triglycerides 126 <150 mg/dL  CBC     Status: Abnormal   Collection Time: 07/03/14  6:45 AM  Result Value Ref Range   WBC 8.7 4.0 - 10.5 K/uL   RBC 3.14 (L) 3.87 - 5.11 MIL/uL   Hemoglobin 9.8 (L) 12.0 - 15.0 g/dL   HCT 09.8 (L) 11.9 - 14.7 %   MCV 95.5 78.0 - 100.0 fL   MCH 31.2 26.0 - 34.0 pg   MCHC 32.7 30.0 - 36.0 g/dL   RDW 82.9 56.2 - 13.0 %   Platelets 219 150 - 400 K/uL  Glucose, capillary     Status: Abnormal   Collection Time: 07/03/14  8:13 AM  Result Value Ref Range   Glucose-Capillary 120 (H) 70 - 99 mg/dL   Comment 1 Notify RN    Comment 2 Documented in Chart   Glucose, capillary     Status: Abnormal   Collection Time: 07/03/14 11:57 AM  Result Value Ref Range   Glucose-Capillary 157 (H) 70 - 99 mg/dL   Comment 1 Notify RN    Comment 2 Documented in Chart      Assessment/Plan:   NEURO  Altered Mental Status:  agitation and sedation   Plan: Will consider changing to Precedex, but will get CT head first  PULM  Atelectasis/collapse (diffuse  and bilateral)   Plan: No significant pneumothorax  CARDIO  Bradycardia (sinus, symptomatic and etiology undefined)   Plan: Will consider cardiology consultation if this continues with weaning.  RENAL  Good urine output   Plan: No changes for now  GI   Tolerating tube feedings   Plan: CPM  ID  No known infectious problems   Plan: CPM  HEME  Anemia acute blood loss anemia and stable hemoglobin)   Plan: Intact  ENDO No known issues   Plan: CPM  Global Issues  Would like to wean this patient and extubate her, but eves changed to CP/PS she become bradycardic into the 30's.  Secretions are not that much.  Will repeat CT head.  Consider Precedex.    LOS: 4 days   Additional comments:I reviewed the patient's new clinical lab test results. cbc/bmet and I reviewed the patients new imaging test results. cxr  Critical Care Total Time*: 30 Minutes  Venetta Knee, JAY 07/03/2014  *Care during the described time interval was provided by me and/or other providers on the critical care team.  I have reviewed this patient's available data, including medical history, events of note, physical examination and test results as part of my evaluation.

## 2014-07-03 NOTE — Progress Notes (Signed)
Patient ID: [name] LauberRhonda [name], female   DOB: 01/27/1961, 53 y.o.   MRN: [id number] Subjective:  The patient is sedated. She has had episodes of bradycardia.  Objective: Vital signs in last 24 hours: Temp:  [99.3 F (37.4 C)-102.2 F (39 C)] 102.2 F (39 C) (11/25 1400) Pulse Rate:  [61-99] 61 (11/25 1400) Resp:  [18-25] 22 (11/25 1400) BP: (99-151)/(57-83) 134/76 mmHg (11/25 1400) SpO2:  [93 %-100 %] 99 % (11/25 1400) Arterial Line BP: (95-178)/(55-91) 150/64 mmHg (11/25 1200) FiO2 (%):  [40 %] 40 % (11/25 1400) Weight:  [97.3 kg (214 lb 8.1 oz)] 97.3 kg (214 lb 8.1 oz) (11/25 0425)  Intake/Output from previous day: 11/24 0701 - 11/25 0700 In: 3120.8 [I.V.:2610.8; NG/GT:510] Out: 3950 [Urine:3950] Intake/Output this shift: Total I/O In: 918.8 [I.V.:648.8; NG/GT:270] Out: 330 [Urine:330]  Physical exam Glasgow Coma Scale 10 intubated, E3M6V1. Her pupils are equal. She localizes. She occasionally follows commands.  I have reviewed the patient's follow-up CT scan. It demonstrates the left epidural hematoma has not changed there is no significant mass effect.  Lab Results:  Recent Labs  07/02/14 0512 07/03/14 0645  WBC 10.0 8.7  HGB 10.3* 9.8*  HCT 30.5* 30.0*  PLT 183 219   BMET  Recent Labs  07/01/14 0303 07/02/14 0238  NA 138 137  K 4.9 4.9  CL 101 101  CO2 26 24  GLUCOSE 132* 128*  BUN 10 16  CREATININE 0.61 0.55  CALCIUM 8.6 8.4    Studies/Results: Ct Head Wo Contrast  07/03/2014   CLINICAL DATA:  Epidural hematoma.  EXAM: CT HEAD WITHOUT CONTRAST  TECHNIQUE: Contiguous axial images were obtained from the base of the skull through the vertex without intravenous contrast.  COMPARISON:  CT scan of June 30, 2014.  FINDINGS: Stable minimally displaced left temporal bone fracture is noted. Fluid is seen in left mastoid air cells. Nondisplaced fracture involving the left anterior maxillary wall is noted with hemorrhage present within the left maxillary sinus.  Mildly depressed left nasal bone fracture is noted.  Stable left epidural hematoma is noted with maximum measured thickness of 9 mm. Stable intraparenchymal hemorrhage is noted in the left parietal cortex. Small amount of hemorrhage is seen in the posterior horns bilaterally which was present on prior exam. Stable small intraparenchymal hemorrhage is noted in right occipital lobe compared to prior exam. Stable intraparenchymal hemorrhages are noted in the right temporal lobe laterally, with increased amount of adjacent white matter edema. Stable small focus of subarachnoid hemorrhage seen involving the vertices bilaterally. No significant midline shift or ventricular dilatation is noted.  IMPRESSION: Stable minimally displaced left temple bone fracture is noted. Slightly increased amount of fluid is noted in left mastoid air cells.  Also noted is nondisplaced fracture involving the anterior wall the left maxillary sinus with hemorrhage within the left maxillary sinus. Mildly depressed left nasal bone fracture is noted as well.  Stable left epidural hematoma is noted compared to prior exam, as well as stable bilateral intraparenchymal hemorrhages as described above. Stable small amount of hemorrhage is noted in the posterior horns bilaterally, but no midline shift or ventricular dilatation is noted. However, there does appear to be an increased amount of white matter edema seen involving the right temporal lobe compared to prior exam.   Electronically Signed   By: Roque LiasJames  Green M.D.   On: 07/03/2014 13:45   Dg Chest Port 1 View  07/03/2014   CLINICAL DATA:  Acute respiratory failure; history of traumatic brain  injury  EXAM: PORTABLE CHEST - 1 VIEW  COMPARISON:  Portable chest x-ray of July 01, 2014.  FINDINGS: The lungs are mildly hypoinflated. There is increased density at the left lung base with partial obscuration of the hemidiaphragm. The cardiac silhouette is enlarged. The central pulmonary vascularity  and the hilar structures are prominent. The endotracheal tube tip projects 4.4 cm above the crotch of the carina. The esophagogastric tube tip projects below the inferior margin of the image.  IMPRESSION: There increased retrocardiac density on the left consistent with atelectasis. Tiny bilateral pleural effusions have developed. The support tubes and lines are in appropriate position.   Electronically Signed   By: David  [place]   On: 07/03/2014 08:11    Assessment/Plan: Traumatic brain injury, epidural hematoma: The patient is neurologically stable.  LOS: 4 days     Nelta Caudill D 07/03/2014, 3:40 PM

## 2014-07-04 ENCOUNTER — Inpatient Hospital Stay (HOSPITAL_COMMUNITY): Payer: No Typology Code available for payment source

## 2014-07-04 LAB — CBC WITH DIFFERENTIAL/PLATELET
BASOS ABS: 0 10*3/uL (ref 0.0–0.1)
Basophils Relative: 0 % (ref 0–1)
Eosinophils Absolute: 0 10*3/uL (ref 0.0–0.7)
Eosinophils Relative: 0 % (ref 0–5)
HEMATOCRIT: 33.1 % — AB (ref 36.0–46.0)
Hemoglobin: 10.9 g/dL — ABNORMAL LOW (ref 12.0–15.0)
Lymphocytes Relative: 5 % — ABNORMAL LOW (ref 12–46)
Lymphs Abs: 0.9 10*3/uL (ref 0.7–4.0)
MCH: 32 pg (ref 26.0–34.0)
MCHC: 32.9 g/dL (ref 30.0–36.0)
MCV: 97.1 fL (ref 78.0–100.0)
MONOS PCT: 8 % (ref 3–12)
Monocytes Absolute: 1.4 10*3/uL — ABNORMAL HIGH (ref 0.1–1.0)
NEUTROS PCT: 87 % — AB (ref 43–77)
Neutro Abs: 14.9 10*3/uL — ABNORMAL HIGH (ref 1.7–7.7)
PLATELETS: 235 10*3/uL (ref 150–400)
RBC: 3.41 MIL/uL — ABNORMAL LOW (ref 3.87–5.11)
RDW: 14.2 % (ref 11.5–15.5)
WBC: 17.2 10*3/uL — AB (ref 4.0–10.5)

## 2014-07-04 LAB — BLOOD GAS, ARTERIAL
Acid-Base Excess: 1 mmol/L (ref 0.0–2.0)
BICARBONATE: 24.8 meq/L — AB (ref 20.0–24.0)
DRAWN BY: 103701
FIO2: 0.4 %
O2 SAT: 92 %
PATIENT TEMPERATURE: 102
PEEP/CPAP: 5 cmH2O
PH ART: 7.406 (ref 7.350–7.450)
PO2 ART: 68.2 mmHg — AB (ref 80.0–100.0)
PRESSURE SUPPORT: 15 cmH2O
TCO2: 25.9 mmol/L (ref 0–100)
pCO2 arterial: 41.2 mmHg (ref 35.0–45.0)

## 2014-07-04 LAB — GLUCOSE, CAPILLARY
GLUCOSE-CAPILLARY: 139 mg/dL — AB (ref 70–99)
GLUCOSE-CAPILLARY: 151 mg/dL — AB (ref 70–99)
GLUCOSE-CAPILLARY: 155 mg/dL — AB (ref 70–99)
GLUCOSE-CAPILLARY: 211 mg/dL — AB (ref 70–99)
Glucose-Capillary: 134 mg/dL — ABNORMAL HIGH (ref 70–99)

## 2014-07-04 LAB — BASIC METABOLIC PANEL
ANION GAP: 13 (ref 5–15)
BUN: 15 mg/dL (ref 6–23)
CHLORIDE: 100 meq/L (ref 96–112)
CO2: 25 mEq/L (ref 19–32)
Calcium: 9.2 mg/dL (ref 8.4–10.5)
Creatinine, Ser: 0.37 mg/dL — ABNORMAL LOW (ref 0.50–1.10)
GFR calc Af Amer: >90 mL/min (ref >90)
Glucose, Bld: 197 mg/dL — ABNORMAL HIGH (ref 70–99)
POTASSIUM: 4.5 meq/L (ref 3.7–5.3)
SODIUM: 138 meq/L (ref 137–147)

## 2014-07-04 MED ORDER — SODIUM CHLORIDE 0.9 % IJ SOLN
10.0000 mL | Freq: Two times a day (BID) | INTRAMUSCULAR | Status: DC
  Administered 2014-07-04: 20 mL
  Administered 2014-07-04 – 2014-07-07 (×5): 10 mL
  Administered 2014-07-07: 20 mL
  Administered 2014-07-08 – 2014-07-10 (×5): 10 mL
  Administered 2014-07-10: 20 mL
  Administered 2014-07-11 – 2014-07-17 (×11): 10 mL
  Administered 2014-07-17: 30 mL
  Administered 2014-07-18: 10 mL
  Administered 2014-07-18 – 2014-07-19 (×2): 20 mL
  Administered 2014-07-19 – 2014-07-28 (×10): 10 mL

## 2014-07-04 MED ORDER — VANCOMYCIN HCL IN DEXTROSE 1-5 GM/200ML-% IV SOLN
1000.0000 mg | Freq: Two times a day (BID) | INTRAVENOUS | Status: DC
  Administered 2014-07-04 – 2014-07-06 (×6): 1000 mg via INTRAVENOUS
  Filled 2014-07-04 (×7): qty 200

## 2014-07-04 MED ORDER — SODIUM CHLORIDE 0.9 % IJ SOLN
10.0000 mL | INTRAMUSCULAR | Status: DC | PRN
  Filled 2014-07-04: qty 20

## 2014-07-04 MED ORDER — PIPERACILLIN-TAZOBACTAM 3.375 G IVPB 30 MIN
3.3750 g | Freq: Once | INTRAVENOUS | Status: AC
  Administered 2014-07-04: 3.375 g via INTRAVENOUS
  Filled 2014-07-04: qty 50

## 2014-07-04 MED ORDER — PIPERACILLIN-TAZOBACTAM 3.375 G IVPB
3.3750 g | Freq: Three times a day (TID) | INTRAVENOUS | Status: DC
  Administered 2014-07-04 – 2014-07-07 (×8): 3.375 g via INTRAVENOUS
  Filled 2014-07-04 (×10): qty 50
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
## 2014-07-04 NOTE — Plan of Care (Signed)
Problem: Consults Goal: Skin Care Protocol Initiated - if Braden Score 18 or less If consults are not indicated, leave blank or document N/A  Outcome: Completed/Met Date Met:  07/04/14 Goal: Nutrition Consult-if indicated Outcome: Completed/Met Date Met:  07/04/14  Problem: Phase I Progression Outcomes Goal: Pain controlled with appropriate interventions Outcome: Completed/Met Date Met:  07/04/14

## 2014-07-04 NOTE — Progress Notes (Signed)
Follow up - Trauma and Critical Care  Patient Details:    [name] [name] is an 53 y.o. female.  Lines/tubes : Airway 7.5 mm (Active)  Secured at (cm) 21 cm 07/04/2014  8:20 AM  Measured From Lips 07/04/2014  8:20 AM  Secured Location Center 07/04/2014  8:20 AM  Secured By [employer] 07/04/2014  8:20 AM  Tube Holder Repositioned Yes 07/04/2014  8:20 AM  Cuff Pressure (cm H2O) 24 cm H2O 07/03/2014  3:11 AM  Site Condition Dry 07/04/2014  8:20 AM     Arterial Line 07/02/14 Left Radial (Active)  Site Assessment Clean;Dry;Intact 07/04/2014  8:00 AM  Line Status Pulsatile blood flow 07/04/2014  8:00 AM  Art Line Waveform Whip 07/04/2014  8:00 AM  Art Line Interventions Zeroed and calibrated;Leveled 07/04/2014  8:00 AM  Color/Movement/Sensation Capillary refill less than 3 sec 07/04/2014  8:00 AM  Dressing Type Transparent 07/04/2014  8:00 AM  Dressing Status Clean;Dry;Intact 07/04/2014  8:00 AM     NG/OG Tube Orogastric 16 Fr. Center mouth (Active)  Placement Verification Auscultation 07/04/2014  8:00 AM  Site Assessment Clean;Dry;Intact 07/04/2014  8:00 AM  Status Infusing tube feed 07/04/2014  8:00 AM  Drainage Appearance Tan 07/04/2014 12:00 AM  Gastric Residual 0 mL 07/04/2014  8:00 AM  Intake (mL) 90 mL 07/04/2014  5:00 AM     Urethral Catheter Marylene Land T, RN Temperature probe 16 Fr. (Active)  Indication for Insertion or Continuance of Catheter Other (comment) 07/04/2014  7:55 AM  Site Assessment Clean;Intact 07/04/2014  8:00 AM  Catheter Maintenance No dependent loops;Bag below level of bladder;Catheter secured;Seal intact;Bag emptied prior to transport;Drainage bag/tubing not touching floor;Insertion date on drainage bag 07/04/2014  7:55 AM  Collection Container Standard drainage bag 07/04/2014  8:00 AM  Securement Method Leg strap 07/04/2014  8:00 AM  Urinary Catheter Interventions Unclamped 07/04/2014  8:00 AM  Output (mL) 225 mL 07/04/2014  6:10 AM     Microbiology/Sepsis markers: Results for orders placed or performed during the hospital encounter of 06/29/14  Culture, respiratory (NON-Expectorated)     Status: None (Preliminary result)   Collection Time: 07/03/14  3:57 PM  Result Value Ref Range Status   Specimen Description TRACHEAL ASPIRATE  Final   Special Requests NONE  Final   Gram Stain   Final    MODERATE WBC PRESENT, PREDOMINANTLY PMN RARE SQUAMOUS EPITHELIAL CELLS PRESENT MODERATE GRAM POSITIVE COCCI IN PAIRS FEW GRAM NEGATIVE RODS Performed at [facility]    Culture PENDING  Incomplete   Report Status PENDING  Incomplete    Anti-infectives:  Anti-infectives    None      Best Practice/Protocols:  VTE Prophylaxis: Mechanical GI Prophylaxis: Proton Pump Inhibitor Continous Sedation  Consults: Treatment Team:  Tressie Stalker, MD    Events:  Subjective:    Overnight Issues: Fevers  Objective:  Vital signs for last 24 hours: Temp:  [98.6 F (37 C)-105.3 F (40.7 C)] 103.3 F (39.6 C) (11/26 1000) Pulse Rate:  [59-89] 86 (11/26 1000) Resp:  [20-31] 29 (11/26 1000) BP: (111-223)/(60-98) 153/72 mmHg (11/26 1000) SpO2:  [90 %-100 %] 91 % (11/26 1000) Arterial Line BP: (133-225)/(60-97) 214/82 mmHg (11/26 1000) FiO2 (%):  [40 %-60 %] 40 % (11/26 1000) Weight:  [102.5 kg (225 lb 15.5 oz)] 102.5 kg (225 lb 15.5 oz) (11/26 0500)  Hemodynamic parameters for last 24 hours:    Intake/Output from previous day: 11/25 0701 - 11/26 0700 In: 3002.5 [I.V.:2154.1; NG/GT:848.3] Out: 2480 [Urine:2480]  Intake/Output this shift: Total I/O In: 282.2 [I.V.:252.2; NG/GT:30] Out: 626 [Urine:625; Emesis/NG output:1]  Vent settings for last 24 hours: Vent Mode:  [-] CPAP;PSV FiO2 (%):  [40 %-60 %] 40 % Set Rate:  [22 bmp] 22 bmp Vt Set:  [460 mL] 460 mL PEEP:  [5 cmH20] 5 cmH20 Pressure Support:  [15 cmH20] 15 cmH20 Plateau Pressure:  [18 cmH20-20 cmH20] 20 cmH20  Physical Exam:  General: no  respiratory distress Neuro: RASS -1 Resp: clear to auscultation bilaterally GI: distended, hypoactive BS and vomited a bit, will restart tube feedings later. Extremities: no edema, no erythema, pulses WNL  Results for orders placed or performed during the hospital encounter of 06/29/14 (from the past 24 hour(s))  Glucose, capillary     Status: Abnormal   Collection Time: 07/03/14 11:57 AM  Result Value Ref Range   Glucose-Capillary 157 (H) 70 - 99 mg/dL   Comment 1 Notify RN    Comment 2 Documented in Chart   Glucose, capillary     Status: Abnormal   Collection Time: 07/03/14  3:29 PM  Result Value Ref Range   Glucose-Capillary 149 (H) 70 - 99 mg/dL   Comment 1 Notify RN    Comment 2 Documented in Chart   Culture, respiratory (NON-Expectorated)     Status: None (Preliminary result)   Collection Time: 07/03/14  3:57 PM  Result Value Ref Range   Specimen Description TRACHEAL ASPIRATE    Special Requests NONE    Gram Stain      MODERATE WBC PRESENT, PREDOMINANTLY PMN RARE SQUAMOUS EPITHELIAL CELLS PRESENT MODERATE GRAM POSITIVE COCCI IN PAIRS FEW GRAM NEGATIVE RODS Performed at [facility]    Culture PENDING    Report Status PENDING   Glucose, capillary     Status: Abnormal   Collection Time: 07/03/14  8:06 PM  Result Value Ref Range   Glucose-Capillary 194 (H) 70 - 99 mg/dL  Glucose, capillary     Status: Abnormal   Collection Time: 07/04/14 12:05 AM  Result Value Ref Range   Glucose-Capillary 211 (H) 70 - 99 mg/dL  Glucose, capillary     Status: Abnormal   Collection Time: 07/04/14  3:51 AM  Result Value Ref Range   Glucose-Capillary 151 (H) 70 - 99 mg/dL  CBC with Differential     Status: Abnormal   Collection Time: 07/04/14  5:15 AM  Result Value Ref Range   WBC 17.2 (H) 4.0 - 10.5 K/uL   RBC 3.41 (L) 3.87 - 5.11 MIL/uL   Hemoglobin 10.9 (L) 12.0 - 15.0 g/dL   HCT 69.633.1 (L) 29.536.0 - 28.446.0 %   MCV 97.1 78.0 - 100.0 fL   MCH 32.0 26.0 - 34.0 pg   MCHC 32.9  30.0 - 36.0 g/dL   RDW 13.214.2 44.011.5 - 10.215.5 %   Platelets 235 150 - 400 K/uL   Neutrophils Relative % 87 (H) 43 - 77 %   Lymphocytes Relative 5 (L) 12 - 46 %   Monocytes Relative 8 3 - 12 %   Eosinophils Relative 0 0 - 5 %   Basophils Relative 0 0 - 1 %   Neutro Abs 14.9 (H) 1.7 - 7.7 K/uL   Lymphs Abs 0.9 0.7 - 4.0 K/uL   Monocytes Absolute 1.4 (H) 0.1 - 1.0 K/uL   Eosinophils Absolute 0.0 0.0 - 0.7 K/uL   Basophils Absolute 0.0 0.0 - 0.1 K/uL   RBC Morphology POLYCHROMASIA PRESENT   Basic metabolic panel  Status: Abnormal   Collection Time: 07/04/14  5:15 AM  Result Value Ref Range   Sodium 138 137 - 147 mEq/L   Potassium 4.5 3.7 - 5.3 mEq/L   Chloride 100 96 - 112 mEq/L   CO2 25 19 - 32 mEq/L   Glucose, Bld 197 (H) 70 - 99 mg/dL   BUN 15 6 - 23 mg/dL   Creatinine, Ser 0.980.37 (L) 0.50 - 1.10 mg/dL   Calcium 9.2 8.4 - 11.910.5 mg/dL   GFR calc non Af Amer >90 >90 mL/min   GFR calc Af Amer >90 >90 mL/min   Anion gap 13 5 - 15  Glucose, capillary     Status: Abnormal   Collection Time: 07/04/14  8:56 AM  Result Value Ref Range   Glucose-Capillary 134 (H) 70 - 99 mg/dL   Comment 1 Documented in Chart    Comment 2 Notify RN      Assessment/Plan:   NEURO  Altered Mental Status:  agitation and sedation   Plan: Trying to get to where we can wean  PULM  Atelectasis/collapse (focal)   Plan: Cultures are pending but GPC are in the grams stain  CARDIO  Sinus Tachycardia   Plan: No specific treatment  RENAL  Urine output is good   Plan: No specific treatment  GI  No injury, possible ileus   Plan: CPM  ID  No known infectious problem   Plan: Will await cultures prior to starting antibiotic unles the patient starts getting sicker  HEME  Anemia acute blood loss anemia)   Plan: No blood for mild anemia  ENDO No issues   Plan: CPM  Global Issues  Patient is weaning safely today without bradycardia, but not able to extubated yet.  Still on CP/PS of 5/15.  Some secretions.  WBC  is elevated.  Will empirically start Vanco and Zosyn for WBC jump to 17K    LOS: 5 days   Additional comments:I reviewed the patient's new clinical lab test results. cxt and I reviewed the patients new imaging test results. CXR tomorrow also.  Critical Care Total Time*: 30 Minutes  Kerby Hockley, JAY 07/04/2014  *Care during the described time interval was provided by me and/or other providers on the critical care team.  I have reviewed this patient's available data, including medical history, events of note, physical examination and test results as part of my evaluation.

## 2014-07-04 NOTE — Progress Notes (Signed)
Subjective: Patient remains intubated  Objective: Vital signs in last 24 hours: Temp:  [98.6 F (37 C)-105.3 F (40.7 C)] 102 F (38.9 C) (11/26 0800) Pulse Rate:  [59-86] 76 (11/26 0800) Resp:  [20-31] 25 (11/26 0800) BP: (111-180)/(60-98) 172/88 mmHg (11/26 0800) SpO2:  [90 %-100 %] 97 % (11/26 0800) Arterial Line BP: (131-222)/(59-97) 222/91 mmHg (11/26 0800) FiO2 (%):  [40 %-60 %] 50 % (11/26 0400) Weight:  [225 lb 15.5 oz (102.5 kg)] 225 lb 15.5 oz (102.5 kg) (11/26 0500)  Intake/Output from previous day: 11/25 0730 - 11/26 0729 In: 3002.5 [I.V.:2154.1; NG/GT:848.3] Out: 2480 [Urine:2480] Intake/Output this shift:    Neurologic exam seems likely unchanged. She opens her eyes to stimulus and I think she will follow simple commands occasionally. SHe does seem to move her left side better than her right side there is the exam is difficult. Her gaze is disconjugate.  Lab Results: Lab Results  Component Value Date   WBC 17.2* 07/04/2014   HGB 10.9* 07/04/2014   HCT 33.1* 07/04/2014   MCV 97.1 07/04/2014   PLT 235 07/04/2014   Lab Results  Component Value Date   INR 1.06 06/29/2014   BMET Lab Results  Component Value Date   NA 138 07/04/2014   K 4.5 07/04/2014   CL 100 07/04/2014   CO2 25 07/04/2014   GLUCOSE 197* 07/04/2014   BUN 15 07/04/2014   CREATININE 0.37* 07/04/2014   CALCIUM 9.2 07/04/2014    Studies/Results: Ct Head Wo Contrast  07/03/2014   CLINICAL DATA:  Epidural hematoma.  EXAM: CT HEAD WITHOUT CONTRAST  TECHNIQUE: Contiguous axial images were obtained from the base of the skull through the vertex without intravenous contrast.  COMPARISON:  CT scan of June 30, 2014.  FINDINGS: Stable minimally displaced left temporal bone fracture is noted. Fluid is seen in left mastoid air cells. Nondisplaced fracture involving the left anterior maxillary wall is noted with hemorrhage present within the left maxillary sinus. Mildly depressed left nasal bone  fracture is noted.  Stable left epidural hematoma is noted with maximum measured thickness of 9 mm. Stable intraparenchymal hemorrhage is noted in the left parietal cortex. Small amount of hemorrhage is seen in the posterior horns bilaterally which was present on prior exam. Stable small intraparenchymal hemorrhage is noted in right occipital lobe compared to prior exam. Stable intraparenchymal hemorrhages are noted in the right temporal lobe laterally, with increased amount of adjacent white matter edema. Stable small focus of subarachnoid hemorrhage seen involving the vertices bilaterally. No significant midline shift or ventricular dilatation is noted.  IMPRESSION: Stable minimally displaced left temple bone fracture is noted. Slightly increased amount of fluid is noted in left mastoid air cells.  Also noted is nondisplaced fracture involving the anterior wall the left maxillary sinus with hemorrhage within the left maxillary sinus. Mildly depressed left nasal bone fracture is noted as well.  Stable left epidural hematoma is noted compared to prior exam, as well as stable bilateral intraparenchymal hemorrhages as described above. Stable small amount of hemorrhage is noted in the posterior horns bilaterally, but no midline shift or ventricular dilatation is noted. However, there does appear to be an increased amount of white matter edema seen involving the right temporal lobe compared to prior exam.   Electronically Signed   By: Roque LiasJames  Green M.D.   On: 07/03/2014 13:45   Dg Chest Port 1 View  07/03/2014   CLINICAL DATA:  Acute respiratory failure; history of traumatic brain injury  EXAM: PORTABLE CHEST - 1 VIEW  COMPARISON:  Portable chest x-ray of July 01, 2014.  FINDINGS: The lungs are mildly hypoinflated. There is increased density at the left lung base with partial obscuration of the hemidiaphragm. The cardiac silhouette is enlarged. The central pulmonary vascularity and the hilar structures are  prominent. The endotracheal tube tip projects 4.4 cm above the crotch of the carina. The esophagogastric tube tip projects below the inferior margin of the image.  IMPRESSION: There increased retrocardiac density on the left consistent with atelectasis. Tiny bilateral pleural effusions have developed. The support tubes and lines are in appropriate position.   Electronically Signed   By: Rakiyah Esch  [place]   On: 07/03/2014 08:11    Assessment/Plan: Overall I think she is stable compared to the previous notes. Continue supportive care   LOS: 5 days    Peggy Loge S 07/04/2014, 8:12 AM

## 2014-07-05 ENCOUNTER — Inpatient Hospital Stay (HOSPITAL_COMMUNITY): Payer: No Typology Code available for payment source

## 2014-07-05 LAB — GLUCOSE, CAPILLARY
GLUCOSE-CAPILLARY: 112 mg/dL — AB (ref 70–99)
Glucose-Capillary: 125 mg/dL — ABNORMAL HIGH (ref 70–99)
Glucose-Capillary: 137 mg/dL — ABNORMAL HIGH (ref 70–99)
Glucose-Capillary: 138 mg/dL — ABNORMAL HIGH (ref 70–99)
Glucose-Capillary: 151 mg/dL — ABNORMAL HIGH (ref 70–99)
Glucose-Capillary: 155 mg/dL — ABNORMAL HIGH (ref 70–99)
Glucose-Capillary: 157 mg/dL — ABNORMAL HIGH (ref 70–99)

## 2014-07-05 LAB — CBC WITH DIFFERENTIAL/PLATELET
Basophils Absolute: 0 10*3/uL (ref 0.0–0.1)
Basophils Relative: 0 % (ref 0–1)
Eosinophils Absolute: 0 10*3/uL (ref 0.0–0.7)
Eosinophils Relative: 0 % (ref 0–5)
HCT: 28.1 % — ABNORMAL LOW (ref 36.0–46.0)
Hemoglobin: 9.3 g/dL — ABNORMAL LOW (ref 12.0–15.0)
LYMPHS ABS: 0.8 10*3/uL (ref 0.7–4.0)
Lymphocytes Relative: 5 % — ABNORMAL LOW (ref 12–46)
MCH: 32.3 pg (ref 26.0–34.0)
MCHC: 33.1 g/dL (ref 30.0–36.0)
MCV: 97.6 fL (ref 78.0–100.0)
Monocytes Absolute: 0.9 10*3/uL (ref 0.1–1.0)
Monocytes Relative: 6 % (ref 3–12)
NEUTROS PCT: 89 % — AB (ref 43–77)
Neutro Abs: 13.9 10*3/uL — ABNORMAL HIGH (ref 1.7–7.7)
Platelets: 178 10*3/uL (ref 150–400)
RBC: 2.88 MIL/uL — AB (ref 3.87–5.11)
RDW: 14.4 % (ref 11.5–15.5)
WBC: 15.6 10*3/uL — AB (ref 4.0–10.5)

## 2014-07-05 LAB — BASIC METABOLIC PANEL
Anion gap: 10 (ref 5–15)
BUN: 26 mg/dL — ABNORMAL HIGH (ref 6–23)
CO2: 26 meq/L (ref 19–32)
Calcium: 9 mg/dL (ref 8.4–10.5)
Chloride: 99 mEq/L (ref 96–112)
Creatinine, Ser: 0.46 mg/dL — ABNORMAL LOW (ref 0.50–1.10)
GFR calc Af Amer: >90 mL/min (ref >90)
GLUCOSE: 183 mg/dL — AB (ref 70–99)
POTASSIUM: 4.1 meq/L (ref 3.7–5.3)
SODIUM: 135 meq/L — AB (ref 137–147)

## 2014-07-05 MED ORDER — ATROPINE SULFATE 0.1 MG/ML IJ SOLN
INTRAMUSCULAR | Status: AC
  Filled 2014-07-05: qty 10

## 2014-07-05 NOTE — Progress Notes (Signed)
Patient remains sedated and intubated. She will follow commands and open eyes 1 off of sedation. Her gaze remains disconjugate per her pupils are equal and reactive. Her right eye deviates somewhat outward and upward. He seems to have bradycardic episodes whenever she is stimulated or turned. Continue current management.
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
## 2014-07-05 NOTE — Progress Notes (Signed)
UR completed.  Continuing to follow to assess for d/c needs.   Carlyle LipaMichelle Javad Salva, RN BSN MHA CCM Trauma/Neuro ICU Case Manager [phone]

## 2014-07-05 NOTE — Progress Notes (Signed)
Placed pt on SBT, pt HR brady to 30.  Pt placed back on full vent support, RN aware.

## 2014-07-06 LAB — BLOOD GAS, ARTERIAL
Acid-Base Excess: 1.5 mmol/L (ref 0.0–2.0)
Bicarbonate: 25 mEq/L — ABNORMAL HIGH (ref 20.0–24.0)
DRAWN BY: 28701
Delivery systems: POSITIVE
EXPIRATORY PAP: 5
FIO2: 0.4 %
INSPIRATORY PAP: 10
O2 SAT: 98.5 %
PATIENT TEMPERATURE: 98.6
PO2 ART: 100 mmHg (ref 80.0–100.0)
TCO2: 26.1 mmol/L (ref 0–100)
pCO2 arterial: 35.2 mmHg (ref 35.0–45.0)
pH, Arterial: 7.464 — ABNORMAL HIGH (ref 7.350–7.450)

## 2014-07-06 LAB — POCT I-STAT 3, ART BLOOD GAS (G3+)
ACID-BASE EXCESS: 3 mmol/L — AB (ref 0.0–2.0)
Bicarbonate: 26.9 meq/L — ABNORMAL HIGH (ref 20.0–24.0)
O2 Saturation: 94 %
PCO2 ART: 38.4 mmHg (ref 35.0–45.0)
PO2 ART: 67 mmHg — AB (ref 80.0–100.0)
Patient temperature: 99.4
TCO2: 28 mmol/L (ref 0–100)
pH, Arterial: 7.456 — ABNORMAL HIGH (ref 7.350–7.450)

## 2014-07-06 LAB — URINE CULTURE: Colony Count: >=100000

## 2014-07-06 LAB — CULTURE, BLOOD (ROUTINE X 2)

## 2014-07-06 LAB — CULTURE, RESPIRATORY

## 2014-07-06 LAB — GLUCOSE, CAPILLARY
Glucose-Capillary: 132 mg/dL — ABNORMAL HIGH (ref 70–99)
Glucose-Capillary: 143 mg/dL — ABNORMAL HIGH (ref 70–99)
Glucose-Capillary: 131 mg/dL — ABNORMAL HIGH (ref 70–99)
Glucose-Capillary: 130 mg/dL — ABNORMAL HIGH (ref 70–99)
Glucose-Capillary: 133 mg/dL — ABNORMAL HIGH (ref 70–99)

## 2014-07-06 LAB — CULTURE, RESPIRATORY W GRAM STAIN

## 2014-07-06 LAB — TRIGLYCERIDES: TRIGLYCERIDES: 278 mg/dL — AB (ref <150)

## 2014-07-06 MED ORDER — DEXMEDETOMIDINE HCL IN NACL 200 MCG/50ML IV SOLN
0.0000 ug/kg/h | INTRAVENOUS | Status: AC
  Administered 2014-07-06 (×2): 0.6 ug/kg/h via INTRAVENOUS
  Administered 2014-07-06: 0.7 ug/kg/h via INTRAVENOUS
  Administered 2014-07-07: 0.8 ug/kg/h via INTRAVENOUS
  Administered 2014-07-07: 0.6 ug/kg/h via INTRAVENOUS
  Administered 2014-07-07: 0.8 ug/kg/h via INTRAVENOUS
  Administered 2014-07-07: 0.9 ug/kg/h via INTRAVENOUS
  Administered 2014-07-07 – 2014-07-08 (×2): 0.8 ug/kg/h via INTRAVENOUS
  Administered 2014-07-08: 1 ug/kg/h via INTRAVENOUS
  Administered 2014-07-08: 0.8 ug/kg/h via INTRAVENOUS
  Filled 2014-07-06 (×9): qty 50
  Filled 2014-07-06 (×2): qty 100

## 2014-07-06 MED ORDER — FENTANYL BOLUS VIA INFUSION
50.0000 ug | INTRAVENOUS | Status: DC | PRN
  Administered 2014-07-06 (×2): 50 ug via INTRAVENOUS
  Administered 2014-07-07: 100 ug via INTRAVENOUS
  Administered 2014-07-07: 50 ug via INTRAVENOUS
  Administered 2014-07-07: 100 ug/kg via INTRAVENOUS
  Administered 2014-07-07: 100 ug via INTRAVENOUS
  Administered 2014-07-07: 100 ug/kg via INTRAVENOUS
  Administered 2014-07-07 (×4): 100 ug via INTRAVENOUS
  Administered 2014-07-08 (×4): 100 ug/kg via INTRAVENOUS
  Administered 2014-07-08 (×3): 50 ug via INTRAVENOUS
  Administered 2014-07-08: 100 ug via INTRAVENOUS
  Administered 2014-07-08: 100 ug/kg via INTRAVENOUS
  Administered 2014-07-08: 01:00:00 via INTRAVENOUS
  Administered 2014-07-09 – 2014-07-10 (×4): 100 ug via INTRAVENOUS
  Administered 2014-07-10 (×2): 50 ug via INTRAVENOUS
  Administered 2014-07-10: 100 ug via INTRAVENOUS
  Filled 2014-07-06 (×2): qty 100

## 2014-07-06 NOTE — Progress Notes (Addendum)
Patient in some respiratory distress.  Suctioned for lots of secretions.  BiPAP started.  C-collar removed for BiPAP but patient only has C-7 transverse process fracture.  Will clear the C-spine.  Will recheck ABG in one hour.  Nasotracheal suction as needed.  Marta LamasJames O. Gae BonWyatt, III, MD, FACS [phone] Trauma Surgeon

## 2014-07-06 NOTE — Progress Notes (Signed)
Follow up - Trauma and Critical Care  Patient Details:    [name] [name] is an 53 y.o. female.  Lines/tubes : Airway 7.5 mm (Active)  Secured at (cm) 21 cm 07/06/2014  8:28 AM  Measured From Lips 07/06/2014  8:28 AM  Secured Location Left 07/06/2014  8:28 AM  Secured By [employer] 07/06/2014  8:28 AM  Tube Holder Repositioned Yes 07/06/2014  8:28 AM  Cuff Pressure (cm H2O) 25 cm H2O 07/06/2014  8:28 AM  Site Condition Dry 07/06/2014  8:28 AM     PICC / Midline Double Lumen 07/04/14 PICC Right Basilic 42 cm 4 cm (Active)  Indication for Insertion or Continuance of Line Prolonged intravenous therapies;Poor Vasculature-patient has had multiple peripheral attempts or PIVs lasting less than 24 hours 07/06/2014  8:00 AM  Exposed Catheter (cm) 4 cm 07/04/2014 12:37 PM  Site Assessment Clean;Dry;Intact 07/06/2014  8:00 AM  Lumen #1 Status Infusing 07/06/2014  8:00 AM  Lumen #2 Status Infusing 07/06/2014  8:00 AM  Dressing Type Transparent;Occlusive 07/06/2014  8:00 AM  Dressing Status Dry;Clean;Intact;Antimicrobial disc in place 07/06/2014  8:00 AM  Dressing Change Due 07/11/14 07/06/2014  8:00 AM     NG/OG Tube Orogastric 16 Fr. Center mouth (Active)  Placement Verification Auscultation 07/05/2014  8:00 PM  Site Assessment Clean;Dry;Intact 07/05/2014  8:00 PM  Status Infusing tube feed 07/05/2014  8:00 PM  Drainage Appearance Tan 07/05/2014  8:00 PM  Gastric Residual 0 mL 07/05/2014  8:00 AM  Intake (mL) 100 mL 07/05/2014  5:00 AM     Urethral Catheter Marylene Land T, RN Temperature probe 16 Fr. (Active)  Indication for Insertion or Continuance of Catheter Other (comment) 07/05/2014  8:00 PM  Site Assessment Clean;Intact 07/05/2014  8:00 PM  Catheter Maintenance Bag below level of bladder;Catheter secured;Drainage bag/tubing not touching floor;No dependent loops;Seal intact 07/05/2014  8:00 PM  Collection Container Standard drainage bag 07/05/2014  8:00 PM  Securement Method  Leg strap 07/05/2014  8:00 PM  Urinary Catheter Interventions Unclamped 07/05/2014  8:00 PM  Output (mL) 150 mL 07/06/2014  8:00 AM    Microbiology/Sepsis markers: Results for orders placed or performed during the hospital encounter of 06/29/14  Culture, respiratory (NON-Expectorated)     Status: None (Preliminary result)   Collection Time: 07/03/14  3:57 PM  Result Value Ref Range Status   Specimen Description TRACHEAL ASPIRATE  Final   Special Requests NONE  Final   Gram Stain   Final    MODERATE WBC PRESENT, PREDOMINANTLY PMN RARE SQUAMOUS EPITHELIAL CELLS PRESENT MODERATE GRAM POSITIVE COCCI IN PAIRS FEW GRAM NEGATIVE RODS Performed at [facility]    Culture   Final    ABUNDANT STAPHYLOCOCCUS AUREUS Note: RIFAMPIN AND GENTAMICIN SHOULD NOT BE USED AS SINGLE DRUGS FOR TREATMENT OF STAPH INFECTIONS. Performed at [facility]    Report Status PENDING  Incomplete  Urine culture     Status: None (Preliminary result)   Collection Time: 07/03/14  4:15 PM  Result Value Ref Range Status   Specimen Description URINE, CATHETERIZED  Final   Special Requests NONE  Final   Culture  Setup Time   Final    07/03/2014 23:13 Performed at [facility]   Final    >=100,000 COLONIES/ML Performed at [facility]    Culture   Final    GRAM NEGATIVE RODS Performed at [facility]    Report Status PENDING  Incomplete  Culture, blood (routine  x 2)     Status: None (Preliminary result)   Collection Time: 07/03/14  8:30 PM  Result Value Ref Range Status   Specimen Description BLOOD RIGHT ARM  Final   Special Requests BOTTLES DRAWN AEROBIC AND ANAEROBIC 10CC  Final   Culture  Setup Time   Final    07/04/2014 01:42 [facility]    Culture   Final           BLOOD CULTURE RECEIVED NO GROWTH TO DATE CULTURE WILL BE HELD FOR 5 DAYS BEFORE ISSUING A FINAL NEGATIVE REPORT [facility]    Report  Status PENDING  Incomplete  Culture, blood (routine x 2)     Status: None (Preliminary result)   Collection Time: 07/03/14  8:43 PM  Result Value Ref Range Status   Specimen Description BLOOD RIGHT FOREARM  Final   Special Requests BOTTLES DRAWN AEROBIC ONLY 4CC  Final   Culture  Setup Time   Final    07/04/2014 01:43 [facility]formed at [facility]    Culture   Final    GRAM POSITIVE COCCI IN CLUSTERS Note: Gram Stain Report Called to,Read Back By and Verified With: ELLIE MESSER 07/05/14 0845 BY SMITHERSJ [facility]formed at [facility]    Report Status PENDING  Incomplete    Anti-infectives:  Anti-infectives    Start     Dose/Rate Route Frequency Ordered Stop   07/04/14 1930  piperacillin-tazobactam (ZOSYN) IVPB 3.375 g     3.375 g12.5 mL/hr over 240 Minutes Intravenous Every 8 hours 07/04/14 1117     07/04/14 1130  piperacillin-tazobactam (ZOSYN) IVPB 3.375 g     3.375 g100 mL/hr over 30 Minutes Intravenous  Once 07/04/14 1117 07/04/14 1300   07/04/14 1130  vancomycin (VANCOCIN) IVPB 1000 mg/200 mL premix     1,000 mg200 mL/hr over 60 Minutes Intravenous Every 12 hours 07/04/14 1117        Best Practice/Protocols:  VTE Prophylaxis: Mechanical GI Prophylaxis: Proton Pump Inhibitor Continous Sedation  Consults: Treatment Team:  Tressie StalkerJeffrey Jenkins, MD    Events:  Subjective:    Overnight Issues: On Precedex.  No acute distress.  Gets anxious easily  Objective:  Vital signs for last 24 hours: Temp:  [98.2 F (36.8 C)-101.5 F (38.6 C)] 99.3 F (37.4 C) (11/28 0800) Pulse Rate:  [56-76] 70 (11/28 0828) Resp:  [16-28] 16 (11/28 0828) BP: (95-123)/(48-72) 106/55 mmHg (11/28 0828) SpO2:  [94 %-100 %] 96 % (11/28 0828) FiO2 (%):  [0.8 %-40 %] 40 % (11/28 0828) Weight:  [99.4 kg (219 lb 2.2 oz)] 99.4 kg (219 lb 2.2 oz) (11/28 0500)  Hemodynamic parameters for last 24 hours:    Intake/Output from previous day: 11/27 0701 - 11/28 0700 In: 2827.6 [I.V.:2147.6;  NG/GT:230; IV Piggyback:450] Out: 1775 [Urine:1775]  Intake/Output this shift: Total I/O In: 219.6 [I.V.:199.6; NG/GT:20] Out: 150 [Urine:150]  Vent settings for last 24 hours: Vent Mode:  [-] CPAP FiO2 (%):  [0.8 %-40 %] 40 % Set Rate:  [22 bmp] 22 bmp Vt Set:  [460 mL] 460 mL PEEP:  [5 cmH20] 5 cmH20 Pressure Support:  [5 cmH20] 5 cmH20 Plateau Pressure:  [19 cmH20-23 cmH20] 20 cmH20  Physical Exam:  General: alert and no respiratory distress Neuro: alert, nonfocal exam and RASS 0 Resp: clear to auscultation bilaterally GI: soft, nontender, BS WNL, no r/g and Tolerating trickle tube feedings Extremities: edema 2+ and inher hands primarily  Results for orders placed or  performed during the hospital encounter of 06/29/14 (from the past 24 hour(s))  Glucose, capillary     Status: Abnormal   Collection Time: 07/05/14 11:49 AM  Result Value Ref Range   Glucose-Capillary 155 (H) 70 - 99 mg/dL  Glucose, capillary     Status: Abnormal   Collection Time: 07/05/14  3:44 PM  Result Value Ref Range   Glucose-Capillary 157 (H) 70 - 99 mg/dL   Comment 1 Notify RN   Glucose, capillary     Status: Abnormal   Collection Time: 07/05/14  8:07 PM  Result Value Ref Range   Glucose-Capillary 137 (H) 70 - 99 mg/dL   Comment 1 Notify RN    Comment 2 Documented in Chart   Glucose, capillary     Status: Abnormal   Collection Time: 07/05/14 11:25 PM  Result Value Ref Range   Glucose-Capillary 138 (H) 70 - 99 mg/dL   Comment 1 Notify RN    Comment 2 Documented in Chart   Triglycerides     Status: Abnormal   Collection Time: 07/06/14  2:33 AM  Result Value Ref Range   Triglycerides 278 (H) <150 mg/dL  Glucose, capillary     Status: Abnormal   Collection Time: 07/06/14  3:37 AM  Result Value Ref Range   Glucose-Capillary 132 (H) 70 - 99 mg/dL   Comment 1 Notify RN    Comment 2 Documented in Chart   Glucose, capillary     Status: Abnormal   Collection Time: 07/06/14  7:54 AM  Result Value  Ref Range   Glucose-Capillary 143 (H) 70 - 99 mg/dL     Assessment/Plan:   NEURO  Altered Mental Status:  agitation, delirium and sedation   Plan: Hopefully we can back off on some of the sedation one she is extubated.  PULM  Atelectasis/collapse (focal)   Plan: CPM, suction and cough and deep breath after extubation.  CARDIO  Bradycardia has not perissted.   Plan: CPM  RENAL  Urine output hs been adequated   Plan: CPM  GI  No specific GI issues   Plan: CPM  ID  Pneumonia (hospital acquired (not ventilator-associated) Staph PNA, on Vanco and Zosyn)   Plan: CPM until specifi sensitivities have been reported.  HEME  Anemia acute blood loss anemia)   Plan: No blood for now  ENDO No specific issues currrently.   Plan: CPM  Global Issues  Will plan on extubating thepateint today.  Has Staph pneumonia and being treated empirically.    LOS: 7 days   Additional comments:I reviewed the patient's new clinical lab test results. cbc/bmet and I reviewed the patients new imaging test results. cxr  Critical Care Total Time*: 30 Minutes  Ion Gonnella, JAY 07/06/2014  *Care during the described time interval was provided by me and/or other providers on the critical care team.  I have reviewed this patient's available data, including medical history, events of note, physical examination and test results as part of my evaluation.

## 2014-07-06 NOTE — Procedures (Signed)
Extubation Procedure Note  Patient Details:   Name: Lajoyce LauberRhonda Holston DOB: 01/16/1961 MRN: [id number]   Airway Documentation:  Airway 7.5 mm (Active)  Secured at (cm) 21 cm 07/06/2014  8:28 AM  Measured From Lips 07/06/2014  8:28 AM  Secured Location Left 07/06/2014  8:28 AM  Secured By [employer] Tube Holder 07/06/2014  8:28 AM  Tube Holder Repositioned Yes 07/06/2014  8:28 AM  Cuff Pressure (cm H2O) 25 cm H2O 07/06/2014  8:28 AM  Site Condition Dry 07/06/2014  8:28 AM    Evaluation  O2 sats: stable throughout Complications: No apparent complications Patient did tolerate procedure well. Bilateral Breath Sounds: Diminished Suctioning: Airway No-Pt would not try to speak, positive cuff leak   Ok AnisKelly Smith, MA 07/06/2014, 10:15 AM

## 2014-07-06 NOTE — Progress Notes (Signed)
Significantly better today. Patient awake and aware. Is beginning to say a few words appropriately. Will follow some simple commands bilaterally with repeated prompting.  Afebrile. Vitals are stable. Laboratory studies acceptable.  Overall progressing well. Continue supportive care. No new recommendations.

## 2014-07-07 ENCOUNTER — Encounter (HOSPITAL_COMMUNITY): Payer: Self-pay | Admitting: *Deleted

## 2014-07-07 LAB — CBC WITH DIFFERENTIAL/PLATELET
BASOS PCT: 0 % (ref 0–1)
Basophils Absolute: 0 10*3/uL (ref 0.0–0.1)
EOS ABS: 0 10*3/uL (ref 0.0–0.7)
EOS PCT: 0 % (ref 0–5)
HCT: 27.1 % — ABNORMAL LOW (ref 36.0–46.0)
HEMOGLOBIN: 8.8 g/dL — AB (ref 12.0–15.0)
LYMPHS ABS: 1.5 10*3/uL (ref 0.7–4.0)
Lymphocytes Relative: 10 % — ABNORMAL LOW (ref 12–46)
MCH: 31.1 pg (ref 26.0–34.0)
MCHC: 32.5 g/dL (ref 30.0–36.0)
MCV: 95.8 fL (ref 78.0–100.0)
Monocytes Absolute: 0.9 10*3/uL (ref 0.1–1.0)
Monocytes Relative: 6 % (ref 3–12)
NEUTROS ABS: 12.4 10*3/uL — AB (ref 1.7–7.7)
Neutrophils Relative %: 84 % — ABNORMAL HIGH (ref 43–77)
Platelets: 229 10*3/uL (ref 150–400)
RBC: 2.83 MIL/uL — ABNORMAL LOW (ref 3.87–5.11)
RDW: 14.3 % (ref 11.5–15.5)
WBC: 14.8 10*3/uL — ABNORMAL HIGH (ref 4.0–10.5)

## 2014-07-07 LAB — GLUCOSE, CAPILLARY
GLUCOSE-CAPILLARY: 140 mg/dL — AB (ref 70–99)
Glucose-Capillary: 136 mg/dL — ABNORMAL HIGH (ref 70–99)
Glucose-Capillary: 154 mg/dL — ABNORMAL HIGH (ref 70–99)
Glucose-Capillary: 127 mg/dL — ABNORMAL HIGH (ref 70–99)
Glucose-Capillary: 138 mg/dL — ABNORMAL HIGH (ref 70–99)
Glucose-Capillary: 126 mg/dL — ABNORMAL HIGH (ref 70–99)

## 2014-07-07 LAB — CLOSTRIDIUM DIFFICILE BY PCR: Toxigenic C. Difficile by PCR: NEGATIVE

## 2014-07-07 LAB — BASIC METABOLIC PANEL
Anion gap: 12 (ref 5–15)
BUN: 21 mg/dL (ref 6–23)
CHLORIDE: 105 meq/L (ref 96–112)
CO2: 26 meq/L (ref 19–32)
Calcium: 9 mg/dL (ref 8.4–10.5)
Creatinine, Ser: 0.63 mg/dL (ref 0.50–1.10)
GFR calc Af Amer: >90 mL/min (ref >90)
GFR calc non Af Amer: >90 mL/min (ref >90)
GLUCOSE: 169 mg/dL — AB (ref 70–99)
Potassium: 4.3 mEq/L (ref 3.7–5.3)
Sodium: 143 mEq/L (ref 137–147)

## 2014-07-07 MED ORDER — INFLUENZA VAC SPLIT QUAD 0.5 ML IM SUSY
0.5000 mL | PREFILLED_SYRINGE | INTRAMUSCULAR | Status: AC
  Administered 2014-07-08: 0.5 mL via INTRAMUSCULAR
  Filled 2014-07-07: qty 0.5

## 2014-07-07 MED ORDER — CEFAZOLIN SODIUM 1-5 GM-% IV SOLN
1.0000 g | Freq: Three times a day (TID) | INTRAVENOUS | Status: DC
  Administered 2014-07-07 – 2014-07-14 (×23): 1 g via INTRAVENOUS
  Filled 2014-07-07 (×26): qty 50

## 2014-07-07 MED ORDER — PNEUMOCOCCAL VAC POLYVALENT 25 MCG/0.5ML IJ INJ
0.5000 mL | INJECTION | INTRAMUSCULAR | Status: AC
  Administered 2014-07-08: 0.5 mL via INTRAMUSCULAR
  Filled 2014-07-07: qty 0.5

## 2014-07-07 NOTE — Progress Notes (Signed)
SLP Cancellation Note  Patient Details Name: [name] LauberRhonda [name] MRN: [id number] DOB: 06/18/1961   Cancelled treatment:       Reason Eval/Treat Not Completed: Patient not medically ready. Per chart, pt is currently on BiPAP and requiring NTS. Will f/u for readiness.   [name] [name], M.A. CCC-SLP [phone]  [name] Hamaiewonsky, [name] [name] 07/07/2014, 3:31 PM

## 2014-07-07 NOTE — Progress Notes (Signed)
Pt grunting and attempting to cough up secretions. No secretions noted upon subglottic and oral suctioning. RT notified.
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
## 2014-07-07 NOTE — Progress Notes (Signed)
Overall stable. No new issues or problems. Patient tolerating extubation well.  She is awake and aware. She adamantly follows commands. Strength is equal bilaterally. Minimally verbal. Pupils equal bilaterally.  Overall making some progress. Continue efforts at supportive care. No new recommendations.

## 2014-07-07 NOTE — Progress Notes (Signed)
RT came by and performed NT suction on patient. Secretions were thick and serosanguinous in color.

## 2014-07-08 ENCOUNTER — Inpatient Hospital Stay (HOSPITAL_COMMUNITY): Payer: No Typology Code available for payment source

## 2014-07-08 DIAGNOSIS — S069X4A Unspecified intracranial injury with loss of consciousness of 6 hours to 24 hours, initial encounter: Secondary | ICD-10-CM

## 2014-07-08 LAB — GLUCOSE, CAPILLARY
GLUCOSE-CAPILLARY: 147 mg/dL — AB (ref 70–99)
GLUCOSE-CAPILLARY: 141 mg/dL — AB (ref 70–99)
Glucose-Capillary: 101 mg/dL — ABNORMAL HIGH (ref 70–99)
Glucose-Capillary: 152 mg/dL — ABNORMAL HIGH (ref 70–99)
Glucose-Capillary: 170 mg/dL — ABNORMAL HIGH (ref 70–99)

## 2014-07-08 LAB — POCT I-STAT 3, ART BLOOD GAS (G3+)
ACID-BASE EXCESS: 6 mmol/L — AB (ref 0.0–2.0)
ACID-BASE EXCESS: 6 mmol/L — AB (ref 0.0–2.0)
BICARBONATE: 28.2 meq/L — AB (ref 20.0–24.0)
Bicarbonate: 28.8 meq/L — ABNORMAL HIGH (ref 20.0–24.0)
O2 Saturation: 93 %
O2 Saturation: 95 %
Patient temperature: 99.4
TCO2: 29 mmol/L (ref 0–100)
TCO2: 30 mmol/L (ref 0–100)
pCO2 arterial: 32.7 mmHg — ABNORMAL LOW (ref 35.0–45.0)
pCO2 arterial: 33.9 mmHg — ABNORMAL LOW (ref 35.0–45.0)
pH, Arterial: 7.544 — ABNORMAL HIGH (ref 7.350–7.450)
pH, Arterial: 7.536 — ABNORMAL HIGH (ref 7.350–7.450)
pO2, Arterial: 59 mmHg — ABNORMAL LOW (ref 80.0–100.0)
pO2, Arterial: 67 mmHg — ABNORMAL LOW (ref 80.0–100.0)

## 2014-07-08 MED ORDER — LORAZEPAM 2 MG/ML IJ SOLN
INTRAMUSCULAR | Status: AC
  Filled 2014-07-08: qty 1

## 2014-07-08 MED ORDER — LORAZEPAM 2 MG/ML IJ SOLN
1.0000 mg | INTRAMUSCULAR | Status: DC | PRN
  Administered 2014-07-08: 2 mg via INTRAVENOUS
  Administered 2014-07-08: 1 mg via INTRAVENOUS
  Administered 2014-07-10 – 2014-07-14 (×4): 2 mg via INTRAVENOUS
  Filled 2014-07-08 (×6): qty 1
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
## 2014-07-08 NOTE — Progress Notes (Signed)
Trauma Service Note  Subjective: Patient a bit responsive.  Suctioned again this AM for secretions.  Got big coughs  Objective: Vital signs in last 24 hours: Temp:  [97.4 F (36.3 C)-102.4 F (39.1 C)] 99.4 F (37.4 C) (11/30 0747) Pulse Rate:  [44-64] 47 (11/30 0800) Resp:  [18-36] 29 (11/30 0800) BP: (123-172)/(58-108) 153/70 mmHg (11/30 0800) SpO2:  [91 %-100 %] 91 % (11/30 0800) FiO2 (%):  [30 %-40 %] 40 % (11/30 0328) Weight:  [99 kg (218 lb 4.1 oz)] 99 kg (218 lb 4.1 oz) (11/30 0437) Last BM Date: 07/04/14  Intake/Output from previous day: 11/29 0701 - 11/30 0700 In: 2781.6 [I.V.:2581.6; IV Piggyback:200] Out: 1715 [Urine:1715] Intake/Output this shift: Total I/O In: 75 [I.V.:75] Out: -   General: No acute distress.  On venti mask and holding saturations well.  Lungs: Coarse and tubular in the right lower lung field.  Cleared well with suctioning  Abd: Benign.  Excellent bowel sounds.  Extremities: No clinical signs or symptoms of DVT  Neuro: Intact  Lab Results: CBC   Recent Labs  07/07/14 0115  WBC 14.8*  HGB 8.8*  HCT 27.1*  PLT 229   BMET  Recent Labs  07/07/14 0115  NA 143  K 4.3  CL 105  CO2 26  GLUCOSE 169*  BUN 21  CREATININE 0.63  CALCIUM 9.0   PT/INR No results for input(s): LABPROT, INR in the last 72 hours. ABG  Recent Labs  07/06/14 1754 07/06/14 1938  PHART 7.456* 7.464*  HCO3 26.9* 25.0*    Studies/Results: No results found.  Anti-infectives: Anti-infectives    Start     Dose/Rate Route Frequency Ordered Stop   07/07/14 1200  ceFAZolin (ANCEF) IVPB 1 g/50 mL premix     1 g100 mL/hr over 30 Minutes Intravenous Every 8 hours 07/07/14 1046     07/04/14 1930  piperacillin-tazobactam (ZOSYN) IVPB 3.375 g  Status:  Discontinued     3.375 g12.5 mL/hr over 240 Minutes Intravenous Every 8 hours 07/04/14 1117 07/07/14 1044   07/04/14 1130  piperacillin-tazobactam (ZOSYN) IVPB 3.375 g     3.375 g100 mL/hr over 30 Minutes  Intravenous  Once 07/04/14 1117 07/04/14 1300   07/04/14 1130  vancomycin (VANCOCIN) IVPB 1000 mg/200 mL premix  Status:  Discontinued     1,000 mg200 mL/hr over 60 Minutes Intravenous Every 12 hours 07/04/14 1117 07/07/14 1044      Assessment/Plan: s/p  Start TBI team evaluation.  Needs occasional NT suctioning.  LOS: 9 days   Marta LamasJames O. Gae BonWyatt, III, MD, FACS [phone] Trauma Surgeon 07/08/2014

## 2014-07-08 NOTE — Plan of Care (Signed)
Problem: Phase I Progression Outcomes Goal: Voiding-avoid urinary catheter unless indicated Outcome: Completed/Met Date Met:  07/08/14     
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
## 2014-07-08 NOTE — Evaluation (Signed)
Clinical/Bedside Swallow Evaluation Patient Details  Name: [name] LauberRhonda [name] MRN: [id number] Date of Birth: 08/21/1960  Today's Date: 07/08/2014 Time: [id number] SLP Time Calculation (min) (ACUTE ONLY): 13 min  Past Medical History: History reviewed. No pertinent past medical history. Past Surgical History: No past surgical history on file. HPI:  see TBI team eval   Assessment / Plan / Recommendation Clinical Impression  Patient presents with a moderate dysphagia, acute and reversible in nature due to combination of cognitive impairements, respiratory status impacting ability to coordinate breath with swallow, and prolonged intubation likely impacting sensation and glottal closure, all resulting in an increased aspiration risk with pos at this time. Prognosis for ability to resume a po diet good. SLP will f/u at bedside.     Aspiration Risk  Severe    Diet Recommendation NPO   Medication Administration: Via alternative means    Other  Recommendations Oral Care Recommendations:  (QID)   Follow Up Recommendations  Inpatient Rehab    Frequency and Duration min 3x week  2 weeks   Pertinent Vitals/Pain n/a        Swallow Study Prior Functional Status  Cognitive/Linguistic Baseline: Information not available  Lives With: Family (sister) Vocation: Other (comment) (not working, unknown reason)    General HPI: see TBI team eval Type of Study: Bedside swallow evaluation Previous Swallow Assessment: none Diet Prior to this Study: NPO;IV Temperature Spikes Noted: Yes Respiratory Status: Nasal cannula History of Recent Intubation: Yes Length of Intubations (days): 7 days Date extubated: 07/06/14 Behavior/Cognition: Alert;Cooperative;Distractible;Requires cueing;Decreased sustained attention (Rancho Level V) Oral Cavity - Dentition: Dentures, top;Poor condition Self-Feeding Abilities: Needs assist Patient Positioning: Upright in bed Baseline Vocal Quality: Low vocal  intensity;Hoarse Volitional Cough: Congested;Weak Volitional Swallow: Unable to elicit    Oral/Motor/Sensory Function Overall Oral Motor/Sensory Function:  (NT formally due to mentation)   Ice Chips Ice chips: Impaired Presentation: Spoon Pharyngeal Phase Impairments: Suspected delayed Swallow;Throat Clearing - Immediate;Cough - Immediate   Thin Liquid Thin Liquid: Not tested    Nectar Thick Nectar Thick Liquid: Not tested   Honey Thick Honey Thick Liquid: Not tested   Puree Puree: Not tested   Solid   GO   [name] Geisel MA, CCC-SLP [phone]  Solid: Not tested       [name] [name] [name] [name] 07/08/2014,2:44 PM

## 2014-07-08 NOTE — Evaluation (Signed)
Speech Language Pathology Evaluation Patient Details Name: [name] LauberRhonda [name] MRN: [id number] DOB: 06/05/1961 Today's Date: 07/08/2014 Time: [id number] SLP Time Calculation (min) (ACUTE ONLY): 40 min  Problem List:  Patient Active Problem List   Diagnosis Date Noted  . TBI (traumatic brain injury) 06/29/2014   Past Medical History: History reviewed. No pertinent past medical history. Past Surgical History: No past surgical history on file. HPI:  see TBI team eval   Assessment / Plan / Recommendation Clinical Impression  Pateint presents with behaviors consistent with a Rancho Level V (confused, inappopriate) with increased restlessness with increased stimulation provided. Patient however able to follow simple commands, particularly in the context of a functional task, sustain attention briefly (30 seconds average) during functional task, and is oriented to self and place. SLP provided mod-max cues for sustained attention via verbal prompts, and max cues for increased intellectual awareness and basic safety.  Patient will benefit from skilled SLP services to faciliate cognitive recovery in the context of a healing brain. Recommend CIR consult.     SLP Assessment  Patient needs continued Speech Lanaguage Pathology Services    Follow Up Recommendations  Inpatient Rehab    Frequency and Duration min 3x week  2 weeks   Pertinent Vitals/Pain Pain Assessment: No/denies pain (despite moaning )   SLP Goals  Potential to Achieve Goals (ACUTE ONLY): Good  SLP Evaluation Prior Functioning  Cognitive/Linguistic Baseline: Information not available  Lives With: Family (sister) Vocation: Other (comment) (not working, unknown reason)   Cognition  Overall Cognitive Status: Impaired/Different from baseline Arousal/Alertness: Awake/alert Orientation Level: Oriented to person;Disoriented to situation;Disoriented to time;Oriented to place Attention: Sustained Sustained Attention:  Impaired Sustained Attention Impairment: Verbal basic;Functional basic Memory: Impaired Memory Impairment: Storage deficit;Decreased recall of new information;Decreased short term memory Decreased Short Term Memory: Verbal basic;Functional basic Awareness: Impaired Awareness Impairment: Intellectual impairment Problem Solving: Impaired Problem Solving Impairment: Verbal basic;Verbal complex;Functional basic;Functional complex Executive Function: Reasoning Reasoning: Impaired Reasoning Impairment: Verbal basic;Functional basic Behaviors: Restless Safety/Judgment: Impaired Comments: poor safety awareness, requires bilateral mittens Rancho [employer] Amigos Scales of Cognitive Functioning: Confused/inappropriate/non-agitated    Comprehension  Auditory Comprehension Overall Auditory Comprehension: Appears within functional limits for tasks assessed (for basic 1-step commands) Yes/No Questions: Within Functional Limits Visual Recognition/Discrimination Discrimination: Not tested Reading Comprehension Reading Status: Not tested    Expression Expression Primary Mode of Expression: Verbal Verbal Expression Overall Verbal Expression: Impaired Initiation: Impaired Automatic Speech: Name Level of Generative/Spontaneous Verbalization: Word Repetition: Impaired Level of Impairment: Word level Naming: Impairment Responsive: 76-100% accurate Confrontation: Impaired Convergent: 75-100% accurate Divergent: Not tested Verbal Errors: Not aware of errors (intermittent appearing language of confusion) Pragmatics: Impairment Impairments: Eye contact Interfering Components: Attention Written Expression Written Expression: Not tested   Oral / Motor Motor Speech Overall Motor Speech: Impaired Respiration: Impaired Level of Impairment: Word Phonation: Low vocal intensity;Hoarse Articulation: Impaired Level of Impairment: Word Intelligibility: Intelligibility reduced Word: 50-74% accurate   GO    [name] LangoLeah Hadyn Blanck MA, CCC-SLP [phone]   [name] [name] 07/08/2014, 2:39 PM

## 2014-07-08 NOTE — Progress Notes (Signed)
Rehab Admissions Coordinator Note:  Patient was screened by Tag Wurtz L for appropriateness for an Inpatient Acute Rehab Consult.  At this time, we are recommending Inpatient Rehab consult.  Lynae Pederson L 07/08/2014, 1:31 PM  I can be reached at [phone].
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
## 2014-07-09 DIAGNOSIS — Z982 Presence of cerebrospinal fluid drainage device: Secondary | ICD-10-CM

## 2014-07-09 DIAGNOSIS — Z931 Gastrostomy status: Secondary | ICD-10-CM

## 2014-07-09 DIAGNOSIS — Z930 Tracheostomy status: Secondary | ICD-10-CM

## 2014-07-09 HISTORY — DX: Gastrostomy status: Z93.1

## 2014-07-09 HISTORY — DX: Presence of cerebrospinal fluid drainage device: Z98.2

## 2014-07-09 HISTORY — DX: Tracheostomy status: Z93.0

## 2014-07-09 LAB — BASIC METABOLIC PANEL
ANION GAP: 15 (ref 5–15)
BUN: 10 mg/dL (ref 6–23)
CALCIUM: 8.2 mg/dL — AB (ref 8.4–10.5)
CO2: 24 mEq/L (ref 19–32)
CREATININE: 0.45 mg/dL — AB (ref 0.50–1.10)
Chloride: 102 mEq/L (ref 96–112)
Glucose, Bld: 115 mg/dL — ABNORMAL HIGH (ref 70–99)
Potassium: 4.4 mEq/L (ref 3.7–5.3)
Sodium: 141 mEq/L (ref 137–147)

## 2014-07-09 LAB — GLUCOSE, CAPILLARY
Glucose-Capillary: 113 mg/dL — ABNORMAL HIGH (ref 70–99)
Glucose-Capillary: 114 mg/dL — ABNORMAL HIGH (ref 70–99)
Glucose-Capillary: 107 mg/dL — ABNORMAL HIGH (ref 70–99)
Glucose-Capillary: 108 mg/dL — ABNORMAL HIGH (ref 70–99)
Glucose-Capillary: 117 mg/dL — ABNORMAL HIGH (ref 70–99)
Glucose-Capillary: 106 mg/dL — ABNORMAL HIGH (ref 70–99)

## 2014-07-09 LAB — CBC WITH DIFFERENTIAL/PLATELET
Basophils Absolute: 0 10*3/uL (ref 0.0–0.1)
Basophils Relative: 0 % (ref 0–1)
EOS ABS: 0 10*3/uL (ref 0.0–0.7)
Eosinophils Relative: 0 % (ref 0–5)
HCT: 29.8 % — ABNORMAL LOW (ref 36.0–46.0)
Hemoglobin: 9.8 g/dL — ABNORMAL LOW (ref 12.0–15.0)
Lymphocytes Relative: 2 % — ABNORMAL LOW (ref 12–46)
Lymphs Abs: 0.6 10*3/uL — ABNORMAL LOW (ref 0.7–4.0)
MCH: 30.8 pg (ref 26.0–34.0)
MCHC: 32.9 g/dL (ref 30.0–36.0)
MCV: 93.7 fL (ref 78.0–100.0)
Monocytes Absolute: 0.6 10*3/uL (ref 0.1–1.0)
Monocytes Relative: 2 % — ABNORMAL LOW (ref 3–12)
NEUTROS PCT: 96 % — AB (ref 43–77)
Neutro Abs: 26.4 10*3/uL — ABNORMAL HIGH (ref 1.7–7.7)
PLATELETS: 177 10*3/uL (ref 150–400)
RBC: 3.18 MIL/uL — AB (ref 3.87–5.11)
RDW: 14.2 % (ref 11.5–15.5)
WBC: 27.6 10*3/uL — ABNORMAL HIGH (ref 4.0–10.5)

## 2014-07-09 MED ORDER — METOPROLOL TARTRATE 1 MG/ML IV SOLN
INTRAVENOUS | Status: AC
  Filled 2014-07-09: qty 5

## 2014-07-09 MED ORDER — METOPROLOL TARTRATE 1 MG/ML IV SOLN
5.0000 mg | INTRAVENOUS | Status: DC
  Administered 2014-07-09 – 2014-07-13 (×25): 5 mg via INTRAVENOUS
  Filled 2014-07-09 (×28): qty 5
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
## 2014-07-09 NOTE — Progress Notes (Signed)
UR completed.  Being considered for CIR at d/c.   Carlyle LipaMichelle Dezaria Methot, RN BSN MHA CCM Trauma/Neuro ICU Case Manager [phone]

## 2014-07-09 NOTE — Progress Notes (Addendum)
Speech Language Pathology Treatment: Dysphagia;Cognitive-Linquistic  Patient Details Name: [name] LauberRhonda [name] MRN: [id number] DOB: 07/09/1961 Today's Date: 07/09/2014 Time: [id number] SLP Time Calculation (min) (ACUTE ONLY): 28 min  Assessment / Plan / Recommendation Clinical Impression  Treatment focused on cognitive and swallowing recovery. Suspect that patient remains in a Rancho Level V however with increased lethargy today which is impacting sustained attention and ultimately,  both swallowing and cognitive function. Moderate verbal, tactile cueing provided for alert state. Max verbal, visual, and tactile cues provided today for initiation and follow through of follow basic 1-step commands during functional task. Verbal communication limited to name and 1-2 additional incoherent single words, all aphonic in nature, despite max verbal cueing to increase volume. Po trials provided with immediate oral acceptance of bolus with min verbal cueing followed by appropriate oral transit of bolus and initiation of swallow sequence however with consistent s/s of decreased airway protection characterized by throat clearing. Patient remains unsafe for pos at this time. Will continue to f/u.    HPI HPI: see TBI team eval   Pertinent Vitals Pain Assessment: Faces Faces Pain Scale: No hurt  SLP Plan  Continue with current plan of care    Recommendations Diet recommendations: NPO Medication Administration: Via alternative means              General recommendations: Rehab consult Oral Care Recommendations:  (QID) Follow up Recommendations: Inpatient Rehab Plan: Continue with current plan of care    GO   [facility] Kadden Osterhout MA, CCC-SLP [phone]   [name] [name] [name] [name] 07/09/2014, 10:22 AM
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
## 2014-07-10 ENCOUNTER — Inpatient Hospital Stay (HOSPITAL_COMMUNITY): Payer: No Typology Code available for payment source

## 2014-07-10 DIAGNOSIS — I480 Paroxysmal atrial fibrillation: Secondary | ICD-10-CM

## 2014-07-10 DIAGNOSIS — R509 Fever, unspecified: Secondary | ICD-10-CM

## 2014-07-10 LAB — GLUCOSE, CAPILLARY
GLUCOSE-CAPILLARY: 115 mg/dL — AB (ref 70–99)
GLUCOSE-CAPILLARY: 154 mg/dL — AB (ref 70–99)
GLUCOSE-CAPILLARY: 163 mg/dL — AB (ref 70–99)
Glucose-Capillary: 122 mg/dL — ABNORMAL HIGH (ref 70–99)
Glucose-Capillary: 125 mg/dL — ABNORMAL HIGH (ref 70–99)
Glucose-Capillary: 126 mg/dL — ABNORMAL HIGH (ref 70–99)

## 2014-07-10 LAB — CULTURE, BLOOD (ROUTINE X 2): CULTURE: NO GROWTH

## 2014-07-10 LAB — CBC
HCT: 33.6 % — ABNORMAL LOW (ref 36.0–46.0)
HEMOGLOBIN: 11.3 g/dL — AB (ref 12.0–15.0)
MCH: 31.5 pg (ref 26.0–34.0)
MCHC: 33.6 g/dL (ref 30.0–36.0)
MCV: 93.6 fL (ref 78.0–100.0)
Platelets: 121 10*3/uL — ABNORMAL LOW (ref 150–400)
RBC: 3.59 MIL/uL — ABNORMAL LOW (ref 3.87–5.11)
RDW: 14.3 % (ref 11.5–15.5)
WBC: 18.2 10*3/uL — ABNORMAL HIGH (ref 4.0–10.5)

## 2014-07-10 LAB — BASIC METABOLIC PANEL
Anion gap: 12 (ref 5–15)
BUN: 11 mg/dL (ref 6–23)
CALCIUM: 8.2 mg/dL — AB (ref 8.4–10.5)
CHLORIDE: 104 meq/L (ref 96–112)
CO2: 26 meq/L (ref 19–32)
Creatinine, Ser: 0.38 mg/dL — ABNORMAL LOW (ref 0.50–1.10)
GFR calc Af Amer: >90 mL/min (ref >90)
GFR calc non Af Amer: >90 mL/min (ref >90)
GLUCOSE: 134 mg/dL — AB (ref 70–99)
Potassium: 4.2 meq/L (ref 3.7–5.3)
Sodium: 142 meq/L (ref 137–147)

## 2014-07-10 MED ORDER — DEXTROSE 5 % IV SOLN
5.0000 mg/h | INTRAVENOUS | Status: DC
  Administered 2014-07-10: 10 mg/h via INTRAVENOUS
  Administered 2014-07-10: 5 mg/h via INTRAVENOUS
  Administered 2014-07-10: 10 mg/h via INTRAVENOUS
  Administered 2014-07-11: 5 mg/h via INTRAVENOUS
  Filled 2014-07-10 (×3): qty 100

## 2014-07-10 NOTE — Progress Notes (Signed)
Pt having multi runs of ST/SVT HR in the 150's. Trama MD on-call notifed and verbal order for Lopressor 5mg  Q4 placed. BP 122/63 MAP 75 O2 97% RR 22. Will continue to moniter Pt.
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
## 2014-07-10 NOTE — Plan of Care (Signed)
Problem: SLP Dysphagia Goals Goal: Patient will demonstrate readiness for PO's Patient will demonstrate readiness for PO's and/or instrumental swallow study as evidenced by:  Outcome: Completed/Met Date Met:  07/10/14
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
## 2014-07-10 NOTE — Progress Notes (Signed)
Patient ID: Lajoyce LauberRhonda Vassell, female   DOB: 08/29/1960, 53 y.o.   MRN: [id number] She has had some tachycardia which has not resolved with lopressor. EKG shows afib with RVR. I will consult cardiology. I suspect she will need amiodarone. Violeta GelinasBurke Hilmar Moldovan, MD, MPH, FACS Trauma: [phone] General Surgery: [phone]
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
## 2014-07-10 NOTE — Progress Notes (Signed)
Pt converted to A.Fib RVR at Starleen Blue0230am. Thompson MD notified; stat EKG complete.

## 2014-07-10 NOTE — Progress Notes (Signed)
Speech Language Pathology Treatment: Dysphagia;Cognitive-Linquistic  Patient Details Name: [name] [name] MRN: [id number] DOB: 09/27/1960 Today's Date: 07/10/2014 Time: [id number] SLP Time Calculation (min) (ACUTE ONLY): 16 min  Assessment / Plan / Recommendation Clinical Impression  Treatment focus on recovery of swallowing and cognitive function. Patient alert and with increased appropriate verbalizations today. Oriented to self and place. Max verbal cueing provided for orientation to situation. Max verbal and visual cues provided for sustained attention to basic task for 15 minute interval. Moderate cues provided for initiation of carryout of basic 1-step directions in the context of a functional task. Po trials administered to determine potential to advance diet. Vocal quality remains aphonic despite max verbal and demonstration cueing for increased intensity suggestive of decreased glottal closure, likely a result of prolonged intubation although cannot r/o cognitive component. Patient with continued s/s of decreased airway protection across consistencies, greatest with thin liquids, characterized by throat clearing post swallow. Although prognosis for ability to initiate a po diet guarded, patient unlikely to tolerate an NG tube at this time given cognitive function with increased agitation/restlessness in response to tactile stimulation and remains nutritionally unsupported. Recommend proceeding with MBS to instrumentally assess function and determine ability to initiate a conservative po diet with use of aspiration precautions to decrease risk.    HPI HPI: see TBI team eval   Pertinent Vitals Pain Assessment:  (back pain reported but unable to provide number) Pain Location: back Pain Intervention(s): Patient requesting pain meds-RN notified  SLP Plan  MBS    Recommendations Diet recommendations: NPO Medication Administration: Via alternative means              Oral Care Recommendations:   (QID) Follow up Recommendations: Inpatient Rehab Plan: MBS    GO   Ferdinand LangoLeah Khyre Germond MA, CCC-SLP [phone]   [name] [name] 07/10/2014, 10:05 AM

## 2014-07-10 NOTE — Progress Notes (Signed)
Trauma Service Note  Subjective: Patient is sitting up with daughter.  Very raspy voice.  Not having any respiratory difficultiesl Objective: Vital signs in last 24 hours: Temp:  [97.4 F (36.3 C)-101.8 F (38.8 C)] 98.2 F (36.8 C) (12/02 0800) Pulse Rate:  [74-142] 105 (12/02 0800) Resp:  [17-47] 39 (12/02 0800) BP: (117-186)/(50-122) 137/79 mmHg (12/02 0800) SpO2:  [93 %-100 %] 93 % (12/02 0800) Weight:  [91.4 kg (201 lb 8 oz)] 91.4 kg (201 lb 8 oz) (12/02 0414) Last BM Date: 07/09/14  Intake/Output from previous day: 12/01 0701 - 12/02 0700 In: 2055.3 [I.V.:1755.3; IV Piggyback:300] Out: -  Intake/Output this shift: Total I/O In: 160 [I.V.:160] Out: -   General: No acute distress.  Trying to communicate  Lungs: Clear to auscultation  Abd: Soft, good bowel sounds, but not able to tolerate swallowing yet.  To get a modified barium swallow later today.  Will hold off on Panda for now.  Extremities: No changes.  No clinical signs or symptoms of DVT  Neuro: Hard to understand how where she is neurologically.  Still significantly disabled.  Lab Results: CBC   Recent Labs  07/09/14 0300 07/10/14 0340  WBC 27.6* 18.2*  HGB 9.8* 11.3*  HCT 29.8* 33.6*  PLT 177 121*   BMET  Recent Labs  07/09/14 0300 07/10/14 0340  NA 141 142  K 4.4 4.2  CL 102 104  CO2 24 26  GLUCOSE 115* 134*  BUN 10 11  CREATININE 0.45* 0.38*  CALCIUM 8.2* 8.2*   PT/INR No results for input(s): LABPROT, INR in the last 72 hours. ABG  Recent Labs  07/08/14 1420 07/08/14 1751  PHART 7.544* 7.536*  HCO3 28.2* 28.8*    Studies/Results: Dg Chest Port 1 View  07/08/2014   CLINICAL DATA:  Pneumonia  EXAM: PORTABLE CHEST - 1 VIEW  COMPARISON:  07/05/2014  FINDINGS: Interval removal of the endotracheal tube and nasogastric tube. Right-sided PICC line with the tip projecting over the cavoatrial junction. No focal consolidation, pleural effusion or pneumothorax. Stable cardiomediastinal  silhouette. Unremarkable osseous structures.  IMPRESSION: No acute cardiopulmonary disease.   Electronically Signed   By: Elige KoHetal  Patel   On: 07/08/2014 10:00    Anti-infectives: Anti-infectives    Start     Dose/Rate Route Frequency Ordered Stop   07/07/14 1200  ceFAZolin (ANCEF) IVPB 1 g/50 mL premix     1 g100 mL/hr over 30 Minutes Intravenous Every 8 hours 07/07/14 1046     07/04/14 1930  piperacillin-tazobactam (ZOSYN) IVPB 3.375 g  Status:  Discontinued     3.375 g12.5 mL/hr over 240 Minutes Intravenous Every 8 hours 07/04/14 1117 07/07/14 1044   07/04/14 1130  piperacillin-tazobactam (ZOSYN) IVPB 3.375 g     3.375 g100 mL/hr over 30 Minutes Intravenous  Once 07/04/14 1117 07/04/14 1300   07/04/14 1130  vancomycin (VANCOCIN) IVPB 1000 mg/200 mL premix  Status:  Discontinued     1,000 mg200 mL/hr over 60 Minutes Intravenous Every 12 hours 07/04/14 1117 07/07/14 1044      Assessment/Plan: s/p  Swallowing evaluation.  Transfer to SDU possible  LOS: 11 days   Marta LamasJames O. Gae BonWyatt, III, MD, FACS [phone] Trauma Surgeon 07/10/2014
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
## 2014-07-11 ENCOUNTER — Encounter (HOSPITAL_COMMUNITY): Payer: Self-pay

## 2014-07-11 ENCOUNTER — Inpatient Hospital Stay (HOSPITAL_COMMUNITY): Payer: No Typology Code available for payment source

## 2014-07-11 DIAGNOSIS — I480 Paroxysmal atrial fibrillation: Secondary | ICD-10-CM | POA: Diagnosis not present

## 2014-07-11 DIAGNOSIS — I63331 Cerebral infarction due to thrombosis of right posterior cerebral artery: Secondary | ICD-10-CM

## 2014-07-11 DIAGNOSIS — I6340 Cerebral infarction due to embolism of unspecified cerebral artery: Secondary | ICD-10-CM

## 2014-07-11 LAB — GLUCOSE, CAPILLARY
GLUCOSE-CAPILLARY: 159 mg/dL — AB (ref 70–99)
GLUCOSE-CAPILLARY: 127 mg/dL — AB (ref 70–99)
Glucose-Capillary: 141 mg/dL — ABNORMAL HIGH (ref 70–99)
Glucose-Capillary: 141 mg/dL — ABNORMAL HIGH (ref 70–99)
Glucose-Capillary: 135 mg/dL — ABNORMAL HIGH (ref 70–99)
Glucose-Capillary: 143 mg/dL — ABNORMAL HIGH (ref 70–99)

## 2014-07-11 LAB — BLOOD GAS, ARTERIAL
Acid-Base Excess: 3.6 mmol/L — ABNORMAL HIGH (ref 0.0–2.0)
Acid-Base Excess: 3.7 mmol/L — ABNORMAL HIGH (ref 0.0–2.0)
BICARBONATE: 26.5 meq/L — AB (ref 20.0–24.0)
Bicarbonate: 26.6 mEq/L — ABNORMAL HIGH (ref 20.0–24.0)
DRAWN BY: 31297
Delivery systems: POSITIVE
Drawn by: 23588
Expiratory PAP: 5
FIO2: 0.4 %
Inspiratory PAP: 10
O2 Content: 6 L/min
O2 Saturation: 95.1 %
O2 Saturation: 94.9 %
PATIENT TEMPERATURE: 98.6
PATIENT TEMPERATURE: 98.6
PH ART: 7.516 — AB (ref 7.350–7.450)
PH ART: 7.532 — AB (ref 7.350–7.450)
TCO2: 27.6 mmol/L (ref 0–100)
TCO2: 27.5 mmol/L (ref 0–100)
pCO2 arterial: 33.1 mmHg — ABNORMAL LOW (ref 35.0–45.0)
pCO2 arterial: 31.7 mmHg — ABNORMAL LOW (ref 35.0–45.0)
pO2, Arterial: 68.5 mmHg — ABNORMAL LOW (ref 80.0–100.0)
pO2, Arterial: 72.2 mmHg — ABNORMAL LOW (ref 80.0–100.0)

## 2014-07-11 LAB — TSH: TSH: 0.436 u[IU]/mL (ref 0.350–4.500)

## 2014-07-11 LAB — BASIC METABOLIC PANEL
Anion gap: 11 (ref 5–15)
BUN: 14 mg/dL (ref 6–23)
CALCIUM: 8.1 mg/dL — AB (ref 8.4–10.5)
CO2: 27 meq/L (ref 19–32)
CREATININE: 0.5 mg/dL (ref 0.50–1.10)
Chloride: 105 mEq/L (ref 96–112)
GFR calc Af Amer: >90 mL/min (ref >90)
GLUCOSE: 136 mg/dL — AB (ref 70–99)
Potassium: 3.3 mEq/L — ABNORMAL LOW (ref 3.7–5.3)
SODIUM: 143 meq/L (ref 137–147)

## 2014-07-11 LAB — CBC
HCT: 32.6 % — ABNORMAL LOW (ref 36.0–46.0)
Hemoglobin: 10.8 g/dL — ABNORMAL LOW (ref 12.0–15.0)
MCH: 30.6 pg (ref 26.0–34.0)
MCHC: 33.1 g/dL (ref 30.0–36.0)
MCV: 92.4 fL (ref 78.0–100.0)
Platelets: 62 10*3/uL — ABNORMAL LOW (ref 150–400)
RBC: 3.53 MIL/uL — AB (ref 3.87–5.11)
RDW: 14.8 % (ref 11.5–15.5)
WBC: 13.9 10*3/uL — ABNORMAL HIGH (ref 4.0–10.5)

## 2014-07-11 MED ORDER — GADOBENATE DIMEGLUMINE 529 MG/ML IV SOLN
20.0000 mL | Freq: Once | INTRAVENOUS | Status: AC | PRN
  Administered 2014-07-11: 20 mL via INTRAVENOUS
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
## 2014-07-11 NOTE — Progress Notes (Signed)
SLP Cancellation Note  Patient Details Name: Lajoyce LauberRhonda Berhe MRN: [id number] DOB: 05/15/1961   Cancelled treatment:       Reason Eval/Treat Not Completed: Medical issues which prohibited therapy; pt with neuro/respiratory decline - potential for reintubation.  Will follow.     Blenda MountsCouture, Pierre Cumpton [name] 07/11/2014, 10:29 AM

## 2014-07-11 NOTE — Progress Notes (Signed)
OT Cancellation Note  Patient Details Name: [name] LauberRhonda [name] MRN: [id number] DOB: 11/16/1960   Cancelled Treatment:    Reason Eval/Treat Not Completed: Medical issues which prohibited therapy. Pt on Bipap and they are contemplating on wether she needs to go back to ICU and be re-intubated. Nursing advised it would be good to not see pt today.  Evette GeorgesLeonard, Remee Charley Eva [phone] 07/11/2014, 12:52 PM

## 2014-07-11 NOTE — Progress Notes (Signed)
    Subjective:  Pt opens her eyes but does not follow examiner  Objective:  Vital Signs in the last 24 hours: Temp:  [98.1 F (36.7 C)-99.9 F (37.7 C)] 98.6 F (37 C) (12/03 0800) Pulse Rate:  [78-102] 81 (12/03 0800) Resp:  [18-47] 40 (12/03 0800) BP: (130-177)/(69-99) 146/82 mmHg (12/03 0800) SpO2:  [87 %-96 %] 95 % (12/03 0800) FiO2 (%):  [55 %] 55 % (12/03 0000) Weight:  [204 lb 9.4 oz (92.8 kg)] 204 lb 9.4 oz (92.8 kg) (12/03 0405)  Intake/Output from previous day:  Intake/Output Summary (Last 24 hours) at 07/11/14 1011 Last data filed at 07/11/14 0828  Gross per 24 hour  Intake 1827.88 ml  Output   1599 ml  Net 228.88 ml    Physical Exam: General appearance: moderate distress, moderately obese and increased resp rate Lungs: rhonchi Heart: regular rate and rhythm   Rate: 82  Rhythm: normal sinus rhythm  Lab Results:  Recent Labs  07/09/14 0300 07/10/14 0340  WBC 27.6* 18.2*  HGB 9.8* 11.3*  PLT 177 121*    Recent Labs  07/09/14 0300 07/10/14 0340  NA 141 142  K 4.4 4.2  CL 102 104  CO2 24 26  GLUCOSE 115* 134*  BUN 10 11  CREATININE 0.45* 0.38*   No results for input(s): TROPONINI in the last 72 hours.  Invalid input(s): CK, MB No results for input(s): INR in the last 72 hours.  Imaging: Imaging results have been reviewed   Assessment/Plan:   Principal Problem:   TBI (traumatic brain injury) Active Problems:   CVA (cerebral infarction)   PAF (paroxysmal atrial fibrillation)   PLAN: Holding NSR on IV Lopressor and Diltiazem. Respiratory deterioration- she may require intubation.   Corine ShelterLuke Kilroy PA-C [facility] [phone] 07/11/2014, 10:11 AM   As above; patient seen and examined; does not respond to questions; remains in sinus; continue cardizem and metoprolol; no anticoagulation given recent trauma; await echo results; TSH normal. Olga MillersBrian Crenshaw

## 2014-07-11 NOTE — Progress Notes (Signed)
EEG completed, results pending. 

## 2014-07-11 NOTE — Progress Notes (Signed)
Patient ID: [name] [name], female   DOB: 07/01/1961, 53 y.o.   MRN: [id number]   LOS: 12 days   Subjective: Noted neurologic and respiratory decline overnight. Noted neurology assessment and plan.   Objective: Vital signs in last 24 hours: Temp:  [98.1 F (36.7 C)-99.9 F (37.7 C)] 98.6 F (37 C) (12/03 0800) Pulse Rate:  [78-102] 81 (12/03 0800) Resp:  [18-57] 40 (12/03 0800) BP: (129-177)/(69-99) 146/82 mmHg (12/03 0800) SpO2:  [87 %-96 %] 95 % (12/03 0800) FiO2 (%):  [55 %] 55 % (12/03 0000) Weight:  [204 lb 9.4 oz (92.8 kg)] 204 lb 9.4 oz (92.8 kg) (12/03 0405) Last BM Date: 07/09/14   UOP: >16000ml/h NET: +98082ml/24h TOTAL: +14.4L/admission   Radiology CT HEAD WITHOUT CONTRAST  TECHNIQUE: Contiguous axial images were obtained from the base of the skull through the vertex without intravenous contrast.  COMPARISON: CT of the head performed 07/03/2014  FINDINGS: The patient's left-sided epidural hematoma is perhaps slightly decreased in size, measuring 8 mm in thickness, with interval evolution. Its attenuation is significantly decreased, reflecting the lack of acute hemorrhage.  Acute infarct at the right temporal lobe is relatively stable in appearance; associated hemorrhage has resolved. There appears to be acute evolving infarct involving portions of the right frontal lobe, not characterized on the prior study.  No significant midline shift is seen. There is no evidence of hydrocephalus. The posterior fossa, including the cerebellum, brainstem and fourth ventricle, is within normal limits.  The two fracture lines through the left temporal bone are relatively stable in appearance. One of these extends across the left inner ear, with blood surrounding the ossicles. Blood is also noted partially filling the left mastoid air cells. There are also fractures involving the lateral wall of the left orbit and left zygomatic arch. A minimally displaced nasal  bone fracture is again noted, and there is also a nondisplaced fracture of the anterior wall of the left maxillary sinus.  There is near-complete opacification of the sphenoid sinus. No significant soft tissue abnormalities are seen.  IMPRESSION: 1. Acute evolving infarct involving portions of the right frontal lobe, not characterized on the prior study. 2. Acute infarct of the right upper lobe is relatively stable; associated hemorrhage has since resolved. 3. Left-sided epidural hematoma has perhaps slightly decreased in size, measuring 8 mm in thickness. Decreased attenuation reflects the lack of interval acute hemorrhage. 4. No significant midline shift seen; no evidence of hydrocephalus. 5. Fractures as previously described; one of these extends across the left inner ear, with blood surrounding the ossicles and partially filling the left mastoid air cells.  These results were called by telephone at the time of interpretation on 07/11/2014 at 2:19 am to [facility], who verbally acknowledged these results.   Electronically Signed  By: Roanna RaiderJeffery Chang M.D.  On: 07/11/2014 02:20   Physical Exam General appearance: No distress per se but awfully tachypneic Resp: rhonchi bilaterally and mild Cardio: regular rate and rhythm GI: normal findings: bowel sounds normal and soft, non-tender Neuro: E4V2M5=11, PERRL, seems to neglect left side   Assessment/Plan: [facility] TBI w/ICC, skull fxs Multiple facial fxs Bilateral pulmonary contusions CVA -- Workup and treatment per stroke team ARF -- Suspect pt needs intubation, will d/w MD. Check CXR and ABG. Afib -- NSR this morning. Cards following FEN -- Will make npo with neurologic decline. Check labs. VTE -- SCD's Dispo -- I spoke with dtr and updated her on condition. She would still like everything done  at this point.  Critical care time: 0940 -- 1020    Freeman CaldronMichael J. Nakayla Rorabaugh, PA-C Pager: [phone] General  Trauma PA Pager: [phone]  07/11/2014
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
## 2014-07-11 NOTE — Progress Notes (Signed)
Paged and spoke with Dr. Luisa Hartornett on-call for Trauma Service regarding CT results indicating acute frontal lobe infarct not previously visualized on prior CTs. No orders received, MD endorsed he would consult Neurology. Will continue to monitor and assess.

## 2014-07-11 NOTE — Progress Notes (Signed)
Pt unavail for EEG at this time. Will try to attempt this afternoon

## 2014-07-11 NOTE — Progress Notes (Signed)
Echo Lab  2D Echocardiogram completed.  Teige Rountree L Braheem Tomasik, RDCS 07/11/2014 10:38 AM

## 2014-07-11 NOTE — Progress Notes (Signed)
Patient ID: [name] LauberRhonda Vonderhaar, female   DOB: 06/25/1961, 53 y.o.   MRN: [id number] Reviewed CT scan and MRI performed today.  Patient has had acute CVA since yesterday. MRI now shows slight amount of intraventricular blood and early hydrocephalus.  On examination patient is alert but nonverbal does not follow commands. She is breathing comfortably.  Most neuro status changes likely due to stroke. I will inform Dr. Lovell SheehanJenkins regarding changes and ventriculomegaly. No acute intervention required.

## 2014-07-11 NOTE — Progress Notes (Signed)
Pt with acute MS changes tonight.  GCS 14 now 10.  CT shows new frontal infarct.  Neurology consulted and has agreed to see.

## 2014-07-11 NOTE — Progress Notes (Signed)
Unable to perform stroke education d/t pt's medical condition/altered mental status.

## 2014-07-11 NOTE — Progress Notes (Signed)
@   [id number] this RN spoke with Dr. Margo AyeHall (Direct Line [phone]), Radiologist, regarding pt's MRI and new found hydrocephalus.  @ 2010 Attending on-call, Dr. Andrey CampanileWilson paged: awaiting return page. @ 2023 Dr. Andrey CampanileWilson returned page and above information conveyed. No orders received. Dr. Danielle DessElsner with neurosurgery will be reconsulted. Will continue to monitor and assess pt.

## 2014-07-11 NOTE — Progress Notes (Signed)
Paged and spoke with Dr. Luisa Hartornett on-call for Trauma Service regarding pt's decline in mentation (GCS: 14-->10,Pupils 3-->5,Somewhat Verbal-->Non-verbal, Follow Simple Commands--> Follows No Commands), decline in respiratory status (lungs previously charted as clear but rhonchus in all lobes on auscultation, O2 demand increased 3LNC-->55% Venti Mask), and acute urinary retention (Bladder Scan 828mL despite pt episode of incontinence). Orders received for STAT Head CT and placement of Foley Catheter. Will implement and continue to monitor.

## 2014-07-11 NOTE — Progress Notes (Signed)
Alerted by nurse that radiologist had called report on pt's MRI stating new acute hydrocephalus. Per nurse pt is unchanged with respect to neuro status and resp - tachypneic, confused, intermittent FC - which apparently she has been for 24hrs;  Spoke with Dr Danielle DessElsner who stated he will review MRI  Mary SellaEric M. Andrey CampanileWilson, MD, FACS General, Bariatric, & Minimally Invasive Surgery [facility] [place]

## 2014-07-11 NOTE — Progress Notes (Signed)
Bilateral carotid artery duplex completed:  1-39% ICA stenosis.  Vertebral artery flow is antegrade.     
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
## 2014-07-12 DIAGNOSIS — G40209 Localization-related (focal) (partial) symptomatic epilepsy and epileptic syndromes with complex partial seizures, not intractable, without status epilepticus: Secondary | ICD-10-CM

## 2014-07-12 DIAGNOSIS — S069X1D Unspecified intracranial injury with loss of consciousness of 30 minutes or less, subsequent encounter: Secondary | ICD-10-CM

## 2014-07-12 LAB — COMPREHENSIVE METABOLIC PANEL
ALK PHOS: 177 U/L — AB (ref 39–117)
ALT: 35 U/L (ref 0–35)
AST: 25 U/L (ref 0–37)
Albumin: 2 g/dL — ABNORMAL LOW (ref 3.5–5.2)
Anion gap: 12 (ref 5–15)
BUN: 15 mg/dL (ref 6–23)
CHLORIDE: 108 meq/L (ref 96–112)
CO2: 25 meq/L (ref 19–32)
Calcium: 8.7 mg/dL (ref 8.4–10.5)
Creatinine, Ser: 0.44 mg/dL — ABNORMAL LOW (ref 0.50–1.10)
GFR calc Af Amer: >90 mL/min (ref >90)
GFR calc non Af Amer: >90 mL/min (ref >90)
GLUCOSE: 138 mg/dL — AB (ref 70–99)
POTASSIUM: 3.5 meq/L — AB (ref 3.7–5.3)
SODIUM: 145 meq/L (ref 137–147)
Total Bilirubin: 1.1 mg/dL (ref 0.3–1.2)
Total Protein: 6 g/dL (ref 6.0–8.3)

## 2014-07-12 LAB — GLUCOSE, CAPILLARY
GLUCOSE-CAPILLARY: 125 mg/dL — AB (ref 70–99)
GLUCOSE-CAPILLARY: 119 mg/dL — AB (ref 70–99)
GLUCOSE-CAPILLARY: 119 mg/dL — AB (ref 70–99)
GLUCOSE-CAPILLARY: 152 mg/dL — AB (ref 70–99)
Glucose-Capillary: 137 mg/dL — ABNORMAL HIGH (ref 70–99)
Glucose-Capillary: 128 mg/dL — ABNORMAL HIGH (ref 70–99)
Glucose-Capillary: 147 mg/dL — ABNORMAL HIGH (ref 70–99)

## 2014-07-12 LAB — CBC WITH DIFFERENTIAL/PLATELET
Basophils Absolute: 0 10*3/uL (ref 0.0–0.1)
Basophils Relative: 0 % (ref 0–1)
Eosinophils Absolute: 0 10*3/uL (ref 0.0–0.7)
Eosinophils Relative: 0 % (ref 0–5)
HCT: 34.5 % — ABNORMAL LOW (ref 36.0–46.0)
Hemoglobin: 11.4 g/dL — ABNORMAL LOW (ref 12.0–15.0)
LYMPHS PCT: 3 % — AB (ref 12–46)
Lymphs Abs: 0.4 10*3/uL — ABNORMAL LOW (ref 0.7–4.0)
MCH: 30.8 pg (ref 26.0–34.0)
MCHC: 33 g/dL (ref 30.0–36.0)
MCV: 93.2 fL (ref 78.0–100.0)
Monocytes Absolute: 0.3 10*3/uL (ref 0.1–1.0)
Monocytes Relative: 2 % — ABNORMAL LOW (ref 3–12)
NEUTROS PCT: 94 % — AB (ref 43–77)
Neutro Abs: 11.2 10*3/uL — ABNORMAL HIGH (ref 1.7–7.7)
PLATELETS: 51 10*3/uL — AB (ref 150–400)
RBC: 3.7 MIL/uL — AB (ref 3.87–5.11)
RDW: 15 % (ref 11.5–15.5)
WBC: 11.9 10*3/uL — AB (ref 4.0–10.5)

## 2014-07-12 LAB — LIPID PANEL
CHOL/HDL RATIO: 37.5 ratio
CHOLESTEROL: 150 mg/dL (ref 0–200)
HDL: 4 mg/dL — ABNORMAL LOW (ref >39)
LDL CALC: 67 mg/dL (ref 0–99)
Triglycerides: 394 mg/dL — ABNORMAL HIGH (ref <150)
VLDL: 79 mg/dL — ABNORMAL HIGH (ref 0–40)

## 2014-07-12 LAB — MAGNESIUM: Magnesium: 2.3 mg/dL (ref 1.5–2.5)

## 2014-07-12 LAB — PREALBUMIN: Prealbumin: 9.4 mg/dL — ABNORMAL LOW (ref 17.0–34.0)

## 2014-07-12 MED ORDER — LEVETIRACETAM IN NACL 500 MG/100ML IV SOLN
500.0000 mg | Freq: Two times a day (BID) | INTRAVENOUS | Status: DC
  Administered 2014-07-12 – 2014-07-18 (×12): 500 mg via INTRAVENOUS
  Filled 2014-07-12 (×14): qty 100

## 2014-07-12 MED ORDER — POTASSIUM CHLORIDE IN NACL 20-0.9 MEQ/L-% IV SOLN
INTRAVENOUS | Status: DC
  Filled 2014-07-12: qty 1000

## 2014-07-12 MED ORDER — POTASSIUM CHLORIDE 10 MEQ/50ML IV SOLN
INTRAVENOUS | Status: AC
  Filled 2014-07-12: qty 50

## 2014-07-12 MED ORDER — TRACE MINERALS CR-CU-F-FE-I-MN-MO-SE-ZN IV SOLN
INTRAVENOUS | Status: DC
  Filled 2014-07-12: qty 1000

## 2014-07-12 MED ORDER — POTASSIUM CHLORIDE 10 MEQ/50ML IV SOLN
10.0000 meq | INTRAVENOUS | Status: AC
  Administered 2014-07-12 (×4): 10 meq via INTRAVENOUS

## 2014-07-12 MED ORDER — POTASSIUM CHLORIDE IN NACL 20-0.9 MEQ/L-% IV SOLN
INTRAVENOUS | Status: AC
  Administered 2014-07-13: 05:00:00 via INTRAVENOUS
  Filled 2014-07-12: qty 1000

## 2014-07-12 MED ORDER — INSULIN ASPART 100 UNIT/ML ~~LOC~~ SOLN
0.0000 [IU] | SUBCUTANEOUS | Status: DC
  Administered 2014-07-12: 1 [IU] via SUBCUTANEOUS
  Administered 2014-07-13 (×3): 2 [IU] via SUBCUTANEOUS
  Administered 2014-07-13: 1 [IU] via SUBCUTANEOUS
  Administered 2014-07-13: 2 [IU] via SUBCUTANEOUS
  Administered 2014-07-13: 1 [IU] via SUBCUTANEOUS
  Administered 2014-07-14 (×3): 2 [IU] via SUBCUTANEOUS
  Administered 2014-07-14 (×2): 3 [IU] via SUBCUTANEOUS
  Administered 2014-07-14 (×2): 2 [IU] via SUBCUTANEOUS
  Administered 2014-07-15 (×4): 3 [IU] via SUBCUTANEOUS
  Administered 2014-07-15 – 2014-07-16 (×3): 2 [IU] via SUBCUTANEOUS
  Administered 2014-07-16: 3 [IU] via SUBCUTANEOUS

## 2014-07-12 MED ORDER — INSULIN ASPART 100 UNIT/ML ~~LOC~~ SOLN: 0.0000 [IU] | Freq: Four times a day (QID) | SUBCUTANEOUS | Status: DC

## 2014-07-12 MED ORDER — CLINIMIX E/DEXTROSE (5/15) 5 % IV SOLN
INTRAVENOUS | Status: AC
  Administered 2014-07-12: 17:00:00 via INTRAVENOUS
  Filled 2014-07-12: qty 1000

## 2014-07-12 NOTE — Progress Notes (Signed)
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 FXs/pneumomediastinum/pulm contusion.  Currently RL V.  Clinical swallow eval with recs for MBS.   Type of Study: Modified Barium Swallowing Study Reason for Referral: Objectively evaluate swallowing function Diet Prior to this Study: NPO Temperature Spikes Noted: No Respiratory Status: Nasal cannula History of Recent Intubation: Yes Length of Intubations (days): 7 days Date extubated: 07/06/14 Behavior/Cognition: Decreased sustained attention;Requires  cueing;Alert;Cooperative;Pleasant mood;Distractible Oral Cavity - Dentition: Dentures, top;Poor condition Self-Feeding Abilities: Needs assist Patient Positioning: Upright in chair Baseline Vocal Quality: Low vocal intensity;Hoarse Volitional Cough: Congested;Weak Volitional Swallow: Unable to elicit Anatomy: Within functional limits Pharyngeal Secretions: Not observed secondary MBS    Reason for Referral Objectively evaluate swallowing function   Oral Phase Oral Preparation/Oral Phase Oral Phase: Impaired Oral - Nectar Oral - Nectar Cup: Left anterior bolus loss;Right anterior bolus  loss Oral - Thin Oral - Thin Cup: Left anterior bolus loss;Right anterior bolus  loss;Weak lingual manipulation Oral - Solids Oral - Puree: Delayed oral transit   Pharyngeal Phase Pharyngeal Phase Pharyngeal Phase: Impaired Pharyngeal - Honey Pharyngeal - Honey  Teaspoon: Premature spillage to valleculae Pharyngeal - Honey Cup: Premature spillage to valleculae Pharyngeal - Nectar Pharyngeal - Nectar Teaspoon: Premature spillage to pyriform  sinuses Pharyngeal - Nectar Cup: Premature spillage to pyriform  sinuses;Reduced airway/laryngeal closure;Penetration/Aspiration  before swallow;Penetration/Aspiration during swallow;Trace  aspiration Penetration/Aspiration details (nectar cup): Material enters  airway, passes BELOW cords and not ejected out despite cough  attempt by patient Pharyngeal - Thin Pharyngeal - Thin Cup: Premature spillage to pyriform  sinuses;Reduced airway/laryngeal closure;Penetration/Aspiration  during swallow;Penetration/Aspiration before swallow;Moderate  aspiration Penetration/Aspiration details (thin cup): Material enters  airway, passes BELOW cords and not ejected out despite cough  attempt by patient Pharyngeal - Solids Pharyngeal - Puree: Premature spillage to valleculae  Cervical Esophageal Phase   Amanda L. Samson Frederic, [place] CCC/SLP Pager [phone]               [name] [name] 07/10/2014, 2:57 PM     Anti-infectives: Anti-infectives    Start     Dose/Rate Route Frequency Ordered Stop   07/07/14 1200  ceFAZolin (ANCEF) IVPB 1 g/50 mL premix     1 g100 mL/hr over 30 Minutes Intravenous Every 8 hours 07/07/14 1046     07/04/14 1930  piperacillin-tazobactam (ZOSYN) IVPB 3.375 g  Status:  Discontinued     3.375 g12.5 mL/hr over 240 Minutes Intravenous Every 8 hours 07/04/14 1117 07/07/14 1044   07/04/14 1130  piperacillin-tazobactam (ZOSYN) IVPB 3.375 g     3.375 g100 mL/hr over 30 Minutes Intravenous  Once 07/04/14 1117 07/04/14 1300   07/04/14 1130  vancomycin (VANCOCIN) IVPB 1000 mg/200 mL premix  Status:  Discontinued     1,000 mg200 mL/hr over 60 Minutes Intravenous Every 12 hours 07/04/14 1117 07/07/14 1044      Assessment/Plan: s/p  Continue ABX therapy due to Post-op infection/Pneumonia Get therapies to start again.  LOS:  13 days   Marta LamasJames O. Gae BonWyatt, III, MD, FACS [phone] Trauma Surgeon 07/12/2014

## 2014-07-12 NOTE — Progress Notes (Signed)
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 [phone]
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
## 2014-07-12 NOTE — Progress Notes (Signed)
I do want the patient started on TPN today.  We will probably not be able to feed this patient enterally for several days, and now we probably cannot place a Panda tube because she is going on and off BiPAP.  Marta LamasJames O. Gae BonWyatt, III, MD, FACS [phone] Trauma Surgeon
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
## 2014-07-13 ENCOUNTER — Inpatient Hospital Stay (HOSPITAL_COMMUNITY): Payer: No Typology Code available for payment source | Admitting: Anesthesiology

## 2014-07-13 ENCOUNTER — Inpatient Hospital Stay (HOSPITAL_COMMUNITY): Payer: No Typology Code available for payment source

## 2014-07-13 LAB — COMPREHENSIVE METABOLIC PANEL
ALK PHOS: 164 U/L — AB (ref 39–117)
ALT: 30 U/L (ref 0–35)
AST: 24 U/L (ref 0–37)
Albumin: 1.9 g/dL — ABNORMAL LOW (ref 3.5–5.2)
Anion gap: 11 (ref 5–15)
BILIRUBIN TOTAL: 1.1 mg/dL (ref 0.3–1.2)
BUN: 19 mg/dL (ref 6–23)
CHLORIDE: 108 meq/L (ref 96–112)
CO2: 26 meq/L (ref 19–32)
CREATININE: 0.52 mg/dL (ref 0.50–1.10)
Calcium: 8.6 mg/dL (ref 8.4–10.5)
GFR calc Af Amer: >90 mL/min (ref >90)
Glucose, Bld: 183 mg/dL — ABNORMAL HIGH (ref 70–99)
POTASSIUM: 4 meq/L (ref 3.7–5.3)
Sodium: 145 meq/L (ref 137–147)
Total Protein: 5.8 g/dL — ABNORMAL LOW (ref 6.0–8.3)

## 2014-07-13 LAB — BLOOD GAS, ARTERIAL
ACID-BASE EXCESS: 1.7 mmol/L (ref 0.0–2.0)
ACID-BASE EXCESS: 1.5 mmol/L (ref 0.0–2.0)
Bicarbonate: 25.5 mEq/L — ABNORMAL HIGH (ref 20.0–24.0)
Bicarbonate: 25.8 mEq/L — ABNORMAL HIGH (ref 20.0–24.0)
DRAWN BY: 24610
Delivery systems: POSITIVE
Drawn by: 252031
Expiratory PAP: 5
FIO2: 1 %
FIO2: 1 %
Inspiratory PAP: 15
LHR: 8 {breaths}/min
LHR: 22 {breaths}/min
MECHVT: 460 mL
O2 SAT: 99.2 %
O2 Saturation: 99.5 %
PCO2 ART: 37.9 mmHg (ref 35.0–45.0)
PEEP: 5 cmH2O
PO2 ART: 380 mmHg — AB (ref 80.0–100.0)
PO2 ART: 280 mmHg — AB (ref 80.0–100.0)
Patient temperature: 98.6
Patient temperature: 102.2
TCO2: 26.6 mmol/L (ref 0–100)
TCO2: 27.2 mmol/L (ref 0–100)
pCO2 arterial: 47.3 mmHg — ABNORMAL HIGH (ref 35.0–45.0)
pH, Arterial: 7.442 (ref 7.350–7.450)
pH, Arterial: 7.368 (ref 7.350–7.450)

## 2014-07-13 LAB — HEMOGLOBIN A1C
HEMOGLOBIN A1C: 6 % — AB (ref <5.7)
Mean Plasma Glucose: 126 mg/dL — ABNORMAL HIGH (ref <117)

## 2014-07-13 LAB — DIFFERENTIAL
Basophils Absolute: 0 10*3/uL (ref 0.0–0.1)
Basophils Relative: 0 % (ref 0–1)
EOS ABS: 0 10*3/uL (ref 0.0–0.7)
Eosinophils Relative: 0 % (ref 0–5)
LYMPHS ABS: 0.4 10*3/uL — AB (ref 0.7–4.0)
LYMPHS PCT: 3 % — AB (ref 12–46)
MONOS PCT: 4 % (ref 3–12)
Monocytes Absolute: 0.4 10*3/uL (ref 0.1–1.0)
Neutro Abs: 11 10*3/uL — ABNORMAL HIGH (ref 1.7–7.7)
Neutrophils Relative %: 93 % — ABNORMAL HIGH (ref 43–77)

## 2014-07-13 LAB — CBC
HCT: 33.2 % — ABNORMAL LOW (ref 36.0–46.0)
Hemoglobin: 10.9 g/dL — ABNORMAL LOW (ref 12.0–15.0)
MCH: 30.8 pg (ref 26.0–34.0)
MCHC: 32.8 g/dL (ref 30.0–36.0)
MCV: 93.8 fL (ref 78.0–100.0)
PLATELETS: 50 10*3/uL — AB (ref 150–400)
RBC: 3.54 MIL/uL — AB (ref 3.87–5.11)
RDW: 15.1 % (ref 11.5–15.5)
WBC: 11.9 10*3/uL — ABNORMAL HIGH (ref 4.0–10.5)

## 2014-07-13 LAB — GLUCOSE, CAPILLARY
GLUCOSE-CAPILLARY: 167 mg/dL — AB (ref 70–99)
GLUCOSE-CAPILLARY: 159 mg/dL — AB (ref 70–99)
GLUCOSE-CAPILLARY: 142 mg/dL — AB (ref 70–99)
Glucose-Capillary: 138 mg/dL — ABNORMAL HIGH (ref 70–99)
Glucose-Capillary: 196 mg/dL — ABNORMAL HIGH (ref 70–99)

## 2014-07-13 LAB — PHOSPHORUS: Phosphorus: 3.7 mg/dL (ref 2.3–4.6)

## 2014-07-13 LAB — MAGNESIUM: Magnesium: 2.4 mg/dL (ref 1.5–2.5)

## 2014-07-13 LAB — PREALBUMIN: PREALBUMIN: 10.2 mg/dL — AB (ref 17.0–34.0)

## 2014-07-13 LAB — TRIGLYCERIDES: Triglycerides: 396 mg/dL — ABNORMAL HIGH (ref <150)

## 2014-07-13 MED ORDER — SODIUM CHLORIDE 0.9 % IV SOLN
20.0000 ug/h | INTRAVENOUS | Status: DC
  Administered 2014-07-13: 20 ug/h via INTRAVENOUS
  Filled 2014-07-13: qty 50

## 2014-07-13 MED ORDER — TRACE MINERALS CR-CU-F-FE-I-MN-MO-SE-ZN IV SOLN
INTRAVENOUS | Status: AC
  Administered 2014-07-13: 18:00:00 via INTRAVENOUS
  Filled 2014-07-13: qty 2000

## 2014-07-13 MED ORDER — FENTANYL CITRATE 0.05 MG/ML IJ SOLN
25.0000 ug | INTRAMUSCULAR | Status: DC | PRN
  Administered 2014-07-13 – 2014-07-17 (×4): 25 ug via INTRAVENOUS
  Administered 2014-07-18: 100 ug via INTRAVENOUS
  Filled 2014-07-13 (×5): qty 2

## 2014-07-13 MED ORDER — IPRATROPIUM-ALBUTEROL 0.5-2.5 (3) MG/3ML IN SOLN: 3.0000 mL | Freq: Four times a day (QID) | RESPIRATORY_TRACT | Status: DC | PRN

## 2014-07-13 MED ORDER — PROPOFOL 10 MG/ML IV BOLUS
INTRAVENOUS | Status: DC | PRN
  Administered 2014-07-13: 100 mg via INTRAVENOUS

## 2014-07-13 MED ORDER — SODIUM CHLORIDE 0.9 % IV SOLN
INTRAVENOUS | Status: DC | PRN
  Administered 2014-07-17: 19:00:00 via INTRAVENOUS

## 2014-07-13 MED ORDER — SUCCINYLCHOLINE CHLORIDE 20 MG/ML IJ SOLN
INTRAMUSCULAR | Status: DC | PRN
  Administered 2014-07-13: 100 mg via INTRAVENOUS

## 2014-07-13 MED ORDER — PROPOFOL 10 MG/ML IV EMUL
5.0000 ug/kg/min | INTRAVENOUS | Status: DC
  Administered 2014-07-13: 10 ug/kg/min via INTRAVENOUS
  Administered 2014-07-14: 25 ug/kg/min via INTRAVENOUS
  Administered 2014-07-14: 30 ug/kg/min via INTRAVENOUS
  Administered 2014-07-15: 20 ug/kg/min via INTRAVENOUS
  Administered 2014-07-15 (×2): 30 ug/kg/min via INTRAVENOUS
  Filled 2014-07-13 (×5): qty 100
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
## 2014-07-13 NOTE — Progress Notes (Addendum)
<BPMBela5rOrte ps PigEye Carl [id number] 68TOccupatDoristi 62TOccupatKentElfredi Archie [id number]lvia Heaps [place] od.

## 2014-07-13 NOTE — Progress Notes (Signed)
Pt breathing 40-50 times per minute on the bipap. Dr. Donell BeersByerly called to get order for Fentanyl 25mcg. Pt showed no improvement after 25mcg bolus of Fentanyl and PRN dose of Ativan. Respiratory at bedside. Dr. Donell BeersByerly notified and instructed me to call anesthesia to intubate. Family notified of plan.
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
## 2014-07-13 NOTE — Progress Notes (Signed)
SLP Cancellation Note  Patient Details Name: [name] LauberRhonda [name] MRN: [id number] DOB: 09/01/1960   Cancelled treatment:        Pt. currently on Bipap with plans to try Venti-mask sometime today per RN.  Will continue to follow for completion of MBS when able   [name], [name] 07/13/2014, 12:16 PM   Breck CoonsLisa Willis [name] M.Ed [employer]SL[employer] Pager [phone]

## 2014-07-13 NOTE — Progress Notes (Signed)
Events of today noted.  Not surprising to me that the daughter has gotten upset because she has gotten upset on multiple occasions since the patient has been hospitalized.  Also not surprising that the patient has been reintubated--we have been trying to avoid this for the last several days and she has continued to have respiratory difficulty in spite of appropriate treatment of her pneumonia. If the [facility] can be [facility] over the weekend to the trauma service, that would be fine.  In the meantime, now that the patient is no longer on BiPAP a soft feeding tube can be passed and the patient could be started on tube feedings.  Marta LamasJames O. Gae BonWyatt, III, MD, FACS [phone] Trauma Surgeon

## 2014-07-13 NOTE — Progress Notes (Addendum)
Asked by Dr Donell BeersByerly  to emergently intubate patient for respiratory failure. Chart reviewed, patient identified, and time-out performed. Propofol 80mg , Succinyl Choline 120mg , administered intravenously.  Larygoscopy performed by Joesph JulyScott Friedman CRNA using a Macintosh #3 blade and a number 8.0 subglottic tube, taped at 23cm at the lip.  Good bilateral breath sounds heard, ETCO2 positive.  Airway to RT, sedation per attending physician. Start: 16:43 End: 16:53 Arta BruceKevin Galvin Aversa MD
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
## 2014-07-13 NOTE — Progress Notes (Signed)
[name] very upset about pt being reintubated. Feels we should not have taken her off Bipap and put her on Sikes. Feels pt needs to be on Bipap for 48 hrs no break. Explained to daughter aspiration risk, skin breakdown risk, and need to progress pt as tolerated. Daughter did not want to hear it. Does not care if skin breaks down. Daughter works at [employer] and has requested pt to be transferred there. Trauma MD notified of wishes- will work on if bed available tomorrow or Monday.

## 2014-07-13 NOTE — Progress Notes (Signed)
1220- After discussing w/ Dr. Donell BeersByerly and RN, pt was placed on 6 lpm [facility] to trial off bipap.  No resp distress noted, RR remained upper 30's as was when on bipap ( RR 35-39 on bipap, but this was reported as pts baseline RR), no increased WOB noted.  VSS.  At 1500, RN called to notify re: pt w/ increased RR 40's.  Pt was being bathed and turned post bowel movement, pt placed back on BIPAP.  Continued to monitor pt.  Pt continued w/ increased RR, RN notified MD and pt was given Fentanyl and ativan.  Still no improvement in RR.  1600-RN called MD again, per MD pt to be intubated by anesthesia.  RN notified anesthesia.    1630- Daughter at bedside and very upset that pt was given a trial off bipap.  RN and myself explained reason for trial off bipap.  Daughter became very upset, and said that she did not want to discuss this further because she "didn't want to get kicked out of the hospital".  RN and myself attempted to calm daughter, but daughter remains upset.   1635- Anesthesia present, preparing for intubation.    1645- Pt intubated by Anesthesia, placed on vent settings per MD orders.  CXray pending, + BBSH w/ rhonchi =, + easy cap color change (purple to yellow).

## 2014-07-14 ENCOUNTER — Inpatient Hospital Stay (HOSPITAL_COMMUNITY): Payer: No Typology Code available for payment source

## 2014-07-14 DIAGNOSIS — J9601 Acute respiratory failure with hypoxia: Secondary | ICD-10-CM

## 2014-07-14 LAB — BASIC METABOLIC PANEL
ANION GAP: 12 (ref 5–15)
BUN: 18 mg/dL (ref 6–23)
CALCIUM: 8.2 mg/dL — AB (ref 8.4–10.5)
CO2: 25 meq/L (ref 19–32)
CREATININE: 0.48 mg/dL — AB (ref 0.50–1.10)
Chloride: 110 meq/L (ref 96–112)
GFR calc Af Amer: >90 mL/min (ref >90)
Glucose, Bld: 194 mg/dL — ABNORMAL HIGH (ref 70–99)
Potassium: 3.9 meq/L (ref 3.7–5.3)
Sodium: 147 meq/L (ref 137–147)

## 2014-07-14 LAB — POCT I-STAT 3, ART BLOOD GAS (G3+)
ACID-BASE EXCESS: 5 mmol/L — AB (ref 0.0–2.0)
Bicarbonate: 29 meq/L — ABNORMAL HIGH (ref 20.0–24.0)
O2 Saturation: 99 %
PCO2 ART: 41 mmHg (ref 35.0–45.0)
PH ART: 7.457 — AB (ref 7.350–7.450)
PO2 ART: 139 mmHg — AB (ref 80.0–100.0)
TCO2: 30 mmol/L (ref 0–100)

## 2014-07-14 LAB — GLUCOSE, CAPILLARY
GLUCOSE-CAPILLARY: 181 mg/dL — AB (ref 70–99)
GLUCOSE-CAPILLARY: 207 mg/dL — AB (ref 70–99)
Glucose-Capillary: 164 mg/dL — ABNORMAL HIGH (ref 70–99)
Glucose-Capillary: 164 mg/dL — ABNORMAL HIGH (ref 70–99)
Glucose-Capillary: 177 mg/dL — ABNORMAL HIGH (ref 70–99)
Glucose-Capillary: 187 mg/dL — ABNORMAL HIGH (ref 70–99)
Glucose-Capillary: 191 mg/dL — ABNORMAL HIGH (ref 70–99)

## 2014-07-14 MED ORDER — METOPROLOL TARTRATE 1 MG/ML IV SOLN
5.0000 mg | Freq: Once | INTRAVENOUS | Status: AC
  Administered 2014-07-14: 5 mg via INTRAVENOUS

## 2014-07-14 MED ORDER — ACETAMINOPHEN 160 MG/5ML PO SOLN
650.0000 mg | Freq: Four times a day (QID) | ORAL | Status: DC | PRN
  Administered 2014-07-14: 650 mg
  Filled 2014-07-14: qty 20.3

## 2014-07-14 MED ORDER — PRO-STAT SUGAR FREE PO LIQD
60.0000 mL | Freq: Four times a day (QID) | ORAL | Status: DC
Start: 2014-07-14 — End: 2014-07-15
  Administered 2014-07-14 – 2014-07-15 (×4): 60 mL via ORAL
  Filled 2014-07-14 (×8): qty 60

## 2014-07-14 MED ORDER — PIVOT 1.5 CAL PO LIQD
1000.0000 mL | ORAL | Status: DC
  Administered 2014-07-14: 1000 mL
  Filled 2014-07-14: qty 1000

## 2014-07-14 MED ORDER — METOPROLOL TARTRATE 1 MG/ML IV SOLN
10.0000 mg | INTRAVENOUS | Status: DC
  Administered 2014-07-14 – 2014-07-15 (×7): 10 mg via INTRAVENOUS
  Administered 2014-07-15: 5 mg via INTRAVENOUS
  Filled 2014-07-14 (×14): qty 10

## 2014-07-14 MED ORDER — TRACE MINERALS CR-CU-F-FE-I-MN-MO-SE-ZN IV SOLN
INTRAVENOUS | Status: DC
  Administered 2014-07-14: 18:00:00 via INTRAVENOUS
  Filled 2014-07-14: qty 2000

## 2014-07-14 NOTE — Progress Notes (Signed)
SLP Cancellation Note  Patient Details Name: [name] LauberRhonda [name] MRN: [id number] DOB: 07/01/1961   Cancelled treatment:       Reason Eval/Treat Not Completed: Medical issues which prohibited therapy. Pt reintubated.  SLP to sign off.  Please reorder for swallow/TBI team when warranted.   Blenda MountsCouture, [name] [name] 07/14/2014, 9:17 AM
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
## 2014-07-14 NOTE — Progress Notes (Signed)
Patient ID: [name], female   DOB: 09/18/1960, [age] y.o.   MRN: [id number]  Have consulted CCM secondary to worsening resp rate, worsening CXR despite sedation.  ?aspiration, ?PE

## 2014-07-14 NOTE — Progress Notes (Signed)
Subjective: Patient reports awakens to voice and attends  Objective: Vital signs in last 24 hours: Temp:  [97.5 F (36.4 C)-102.5 F (39.2 C)] 102.5 F (39.2 C) (12/06 0800) Pulse Rate:  [95-143] 111 (12/06 0800) Resp:  [18-43] 25 (12/06 0800) BP: (122-166)/(69-93) 139/82 mmHg (12/06 0800) SpO2:  [88 %-100 %] 97 % (12/06 0800) FiO2 (%):  [50 %-100 %] 50 % (12/06 0740) Weight:  [91.3 kg (201 lb 4.5 oz)] 91.3 kg (201 lb 4.5 oz) (12/06 0400)  Intake/Output from previous day: 12/05 0701 - 12/06 0700 In: 2957.4 [I.V.:542.1; IV Piggyback:700; TPN:1475.3] Out: 1745 [Urine:1745] Intake/Output this shift:    Physical Exam: Follows commands and attends to voice.  Re-intubated yesterday for respiratory failure.  Lab Results:  Recent Labs  07/12/14 1115 07/13/14 0440  WBC 11.9* 11.9*  HGB 11.4* 10.9*  HCT 34.5* 33.2*  PLT 51* 50*   BMET  Recent Labs  07/13/14 0440 07/14/14 0428  NA 145 147  K 4.0 3.9  CL 108 110  CO2 26 25  GLUCOSE 183* 194*  BUN 19 18  CREATININE 0.52 0.48*  CALCIUM 8.6 8.2*    Studies/Results: Dg Chest 1 View  07/13/2014   CLINICAL DATA:  Respiratory distress  EXAM: CHEST - 1 VIEW  COMPARISON:  07/11/2014  FINDINGS: Mild patchy bilateral lower lobe opacities, likely atelectasis. Possible mild right pleural effusion. No pneumothorax.  Cardiomegaly.  Stable right arm PICC terminating in the lower SVC.  IMPRESSION: Mild patchy bilateral lower lobe opacities, likely atelectasis.  Possible mild right pleural effusion.   Electronically Signed   By: Charline BillsSriyesh  Krishnan M.D.   On: 07/13/2014 03:01   Dg Chest Port 1 View  07/13/2014   CLINICAL DATA:  Intubated  EXAM: PORTABLE CHEST - 1 VIEW  COMPARISON:  07/13/2014 at 2:41 a.m.  FINDINGS: Endotracheal tube has been placed with tip 3 cm above the carina. Right-sided PICC line remains in place with tip over the mid to distal SVC. Cardiomegaly persists with probable posteriorly tracking small pleural effusions.  Lungs are otherwise clear.  IMPRESSION: Endotracheal tube appropriately positioned.  No other change.   Electronically Signed   By: Christiana PellantGretchen  Green M.D.   On: 07/13/2014 18:09    Assessment/Plan: More alert than yesterday.  More comfortable back on ventilator.  Repeat Head CT in am.    LOS: 15 days    Lazar Tierce D, MD 07/14/2014, 9:43 AM
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
## 2014-07-14 NOTE — Progress Notes (Signed)
  Subjective: Intubated and sedated Hemodynamically stable  Objective: Vital signs in last 24 hours: Temp:  [97.5 F (36.4 C)-102.5 F (39.2 C)] 102.5 F (39.2 C) (12/06 0800) Pulse Rate:  [95-143] 111 (12/06 0800) Resp:  [18-43] 25 (12/06 0800) BP: (122-166)/(69-93) 139/82 mmHg (12/06 0800) SpO2:  [88 %-100 %] 97 % (12/06 0800) FiO2 (%):  [50 %-100 %] 50 % (12/06 0740) Weight:  [201 lb 4.5 oz (91.3 kg)] 201 lb 4.5 oz (91.3 kg) (12/06 0400) Last BM Date: 07/09/14  Intake/Output from previous day: 12/05 0701 - 12/06 0700 In: 2957.4 [I.V.:542.1; IV Piggyback:700; TPN:1475.3] Out: 1745 [Urine:1745] Intake/Output this shift:   Exam: Lungs clear Intubated and sedated  Lab Results:   Recent Labs  07/12/14 1115 07/13/14 0440  WBC 11.9* 11.9*  HGB 11.4* 10.9*  HCT 34.5* 33.2*  PLT 51* 50*   BMET  Recent Labs  07/13/14 0440 07/14/14 0428  NA 145 147  K 4.0 3.9  CL 108 110  CO2 26 25  GLUCOSE 183* 194*  BUN 19 18  CREATININE 0.52 0.48*  CALCIUM 8.6 8.2*   PT/INR No results for input(s): LABPROT, INR in the last 72 hours. ABG  Recent Labs  07/13/14 0227 07/13/14 1800  PHART 7.442 7.368  HCO3 25.5* 25.8*    Studies/Results: Dg Chest 1 View  07/13/2014   CLINICAL DATA:  Respiratory distress  EXAM: CHEST - 1 VIEW  COMPARISON:  07/11/2014  FINDINGS: Mild patchy bilateral lower lobe opacities, likely atelectasis. Possible mild right pleural effusion. No pneumothorax.  Cardiomegaly.  Stable right arm PICC terminating in the lower SVC.  IMPRESSION: Mild patchy bilateral lower lobe opacities, likely atelectasis.  Possible mild right pleural effusion.   Electronically Signed   By: Charline BillsSriyesh  Krishnan M.D.   On: 07/13/2014 03:01   Dg Chest Port 1 View  07/13/2014   CLINICAL DATA:  Intubated  EXAM: PORTABLE CHEST - 1 VIEW  COMPARISON:  07/13/2014 at 2:41 a.m.  FINDINGS: Endotracheal tube has been placed with tip 3 cm above the carina. Right-sided PICC line remains in  place with tip over the mid to distal SVC. Cardiomegaly persists with probable posteriorly tracking small pleural effusions. Lungs are otherwise clear.  IMPRESSION: Endotracheal tube appropriately positioned.  No other change.   Electronically Signed   By: Christiana PellantGretchen  Green M.D.   On: 07/13/2014 18:09    Anti-infectives: Anti-infectives    Start     Dose/Rate Route Frequency Ordered Stop   07/07/14 1200  ceFAZolin (ANCEF) IVPB 1 g/50 mL premix     1 g100 mL/hr over 30 Minutes Intravenous Every 8 hours 07/07/14 1046     07/04/14 1930  piperacillin-tazobactam (ZOSYN) IVPB 3.375 g  Status:  Discontinued     3.375 g12.5 mL/hr over 240 Minutes Intravenous Every 8 hours 07/04/14 1117 07/07/14 1044   07/04/14 1130  piperacillin-tazobactam (ZOSYN) IVPB 3.375 g     3.375 g100 mL/hr over 30 Minutes Intravenous  Once 07/04/14 1117 07/04/14 1300   07/04/14 1130  vancomycin (VANCOCIN) IVPB 1000 mg/200 mL premix  Status:  Discontinued     1,000 mg200 mL/hr over 60 Minutes Intravenous Every 12 hours 07/04/14 1117 07/07/14 1044      Assessment/Plan:  Multiple trauma  Continuing ventilation and sedation Nutrition consult for starting tube feeds Tylenol for fever Repeat CXR in the morning Family requesting transfer to tertiary care facility  LOS: 15 days    [name] [name] A 07/14/2014

## 2014-07-14 NOTE — Progress Notes (Signed)
  Maintaining NSR. No further AF or other arrhythmias.  We will sign off. Please call if we can be of help.   Haruye Lainez,MD 10:00 AM

## 2014-07-15 ENCOUNTER — Inpatient Hospital Stay (HOSPITAL_COMMUNITY): Payer: No Typology Code available for payment source

## 2014-07-15 DIAGNOSIS — J9621 Acute and chronic respiratory failure with hypoxia: Secondary | ICD-10-CM

## 2014-07-15 LAB — BLOOD GAS, ARTERIAL
ACID-BASE EXCESS: 1.5 mmol/L (ref 0.0–2.0)
ACID-BASE EXCESS: 2.6 mmol/L — AB (ref 0.0–2.0)
BICARBONATE: 24.8 meq/L — AB (ref 20.0–24.0)
BICARBONATE: 26 meq/L — AB (ref 20.0–24.0)
Drawn by: 41977
Drawn by: 24610
FIO2: 0.4 %
FIO2: 0.4 %
LHR: 22 {breaths}/min
MECHVT: 460 mL
O2 SAT: 98.9 %
O2 SAT: 98.7 %
PCO2 ART: 36.3 mmHg (ref 35.0–45.0)
PEEP: 5 cmH2O
PEEP: 5 cmH2O
PO2 ART: 144 mmHg — AB (ref 80.0–100.0)
PO2 ART: 134 mmHg — AB (ref 80.0–100.0)
Patient temperature: 98.6
Patient temperature: 98.6
Pressure control: 15 cmH2O
RATE: 16 resp/min
TCO2: 25.9 mmol/L (ref 0–100)
TCO2: 27.2 mmol/L (ref 0–100)
pCO2 arterial: 34.2 mmHg — ABNORMAL LOW (ref 35.0–45.0)
pH, Arterial: 7.475 — ABNORMAL HIGH (ref 7.350–7.450)
pH, Arterial: 7.469 — ABNORMAL HIGH (ref 7.350–7.450)

## 2014-07-15 LAB — DIFFERENTIAL
Basophils Absolute: 0 10*3/uL (ref 0.0–0.1)
Basophils Relative: 0 % (ref 0–1)
EOS ABS: 0 10*3/uL (ref 0.0–0.7)
EOS PCT: 0 % (ref 0–5)
LYMPHS ABS: 0.4 10*3/uL — AB (ref 0.7–4.0)
Lymphocytes Relative: 9 % — ABNORMAL LOW (ref 12–46)
MONO ABS: 0.3 10*3/uL (ref 0.1–1.0)
Monocytes Relative: 5 % (ref 3–12)
Neutro Abs: 4.4 10*3/uL (ref 1.7–7.7)
Neutrophils Relative %: 86 % — ABNORMAL HIGH (ref 43–77)

## 2014-07-15 LAB — URINE CULTURE
Colony Count: NO GROWTH
Culture: NO GROWTH

## 2014-07-15 LAB — COMPREHENSIVE METABOLIC PANEL
ALBUMIN: 1.5 g/dL — AB (ref 3.5–5.2)
ALK PHOS: 196 U/L — AB (ref 39–117)
ALT: 209 U/L — AB (ref 0–35)
AST: 257 U/L — AB (ref 0–37)
Anion gap: 12 (ref 5–15)
BILIRUBIN TOTAL: 2.1 mg/dL — AB (ref 0.3–1.2)
BUN: 25 mg/dL — ABNORMAL HIGH (ref 6–23)
CHLORIDE: 110 meq/L (ref 96–112)
CO2: 22 meq/L (ref 19–32)
Calcium: 8 mg/dL — ABNORMAL LOW (ref 8.4–10.5)
Creatinine, Ser: 0.45 mg/dL — ABNORMAL LOW (ref 0.50–1.10)
GFR calc Af Amer: >90 mL/min (ref >90)
Glucose, Bld: 239 mg/dL — ABNORMAL HIGH (ref 70–99)
POTASSIUM: 3.2 meq/L — AB (ref 3.7–5.3)
SODIUM: 144 meq/L (ref 137–147)
Total Protein: 5.2 g/dL — ABNORMAL LOW (ref 6.0–8.3)

## 2014-07-15 LAB — CLOSTRIDIUM DIFFICILE BY PCR: CDIFFPCR: NEGATIVE

## 2014-07-15 LAB — GLUCOSE, CAPILLARY
GLUCOSE-CAPILLARY: 207 mg/dL — AB (ref 70–99)
Glucose-Capillary: 196 mg/dL — ABNORMAL HIGH (ref 70–99)
Glucose-Capillary: 209 mg/dL — ABNORMAL HIGH (ref 70–99)
Glucose-Capillary: 219 mg/dL — ABNORMAL HIGH (ref 70–99)
Glucose-Capillary: 221 mg/dL — ABNORMAL HIGH (ref 70–99)
Glucose-Capillary: 222 mg/dL — ABNORMAL HIGH (ref 70–99)

## 2014-07-15 LAB — PREALBUMIN: PREALBUMIN: 11.4 mg/dL — AB (ref 17.0–34.0)

## 2014-07-15 LAB — CBC
HCT: 28.9 % — ABNORMAL LOW (ref 36.0–46.0)
HEMOGLOBIN: 9.3 g/dL — AB (ref 12.0–15.0)
MCH: 31 pg (ref 26.0–34.0)
MCHC: 32.2 g/dL (ref 30.0–36.0)
MCV: 96.3 fL (ref 78.0–100.0)
Platelets: 62 10*3/uL — ABNORMAL LOW (ref 150–400)
RBC: 3 MIL/uL — AB (ref 3.87–5.11)
RDW: 15.5 % (ref 11.5–15.5)
WBC: 5.2 10*3/uL (ref 4.0–10.5)

## 2014-07-15 LAB — TRIGLYCERIDES: TRIGLYCERIDES: 271 mg/dL — AB (ref <150)

## 2014-07-15 LAB — PHOSPHORUS: Phosphorus: 2.7 mg/dL (ref 2.3–4.6)

## 2014-07-15 LAB — MAGNESIUM: Magnesium: 2 mg/dL (ref 1.5–2.5)

## 2014-07-15 MED ORDER — POTASSIUM CHLORIDE 2 MEQ/ML IV SOLN
INTRAVENOUS | Status: DC
  Administered 2014-07-15 – 2014-07-22 (×8): via INTRAVENOUS
  Filled 2014-07-15 (×13): qty 1000

## 2014-07-15 MED ORDER — PIPERACILLIN-TAZOBACTAM 3.375 G IVPB 30 MIN
3.3750 g | Freq: Once | INTRAVENOUS | Status: AC
  Administered 2014-07-15: 3.375 g via INTRAVENOUS
  Filled 2014-07-15: qty 50

## 2014-07-15 MED ORDER — PRO-STAT SUGAR FREE PO LIQD
60.0000 mL | Freq: Three times a day (TID) | ORAL | Status: DC
  Administered 2014-07-15 – 2014-07-22 (×17): 60 mL via ORAL
  Filled 2014-07-15 (×23): qty 60

## 2014-07-15 MED ORDER — PROPRANOLOL HCL 20 MG/5ML PO SOLN: 20.0000 mg | Freq: Three times a day (TID) | ORAL | Status: DC

## 2014-07-15 MED ORDER — VANCOMYCIN HCL IN DEXTROSE 1-5 GM/200ML-% IV SOLN
1000.0000 mg | Freq: Three times a day (TID) | INTRAVENOUS | Status: DC
  Administered 2014-07-15 – 2014-07-17 (×7): 1000 mg via INTRAVENOUS
  Filled 2014-07-15 (×8): qty 200

## 2014-07-15 MED ORDER — PIPERACILLIN-TAZOBACTAM 3.375 G IVPB
3.3750 g | Freq: Three times a day (TID) | INTRAVENOUS | Status: DC
  Administered 2014-07-15 – 2014-07-17 (×7): 3.375 g via INTRAVENOUS
  Filled 2014-07-15 (×9): qty 50

## 2014-07-15 MED ORDER — PIVOT 1.5 CAL PO LIQD
1000.0000 mL | ORAL | Status: DC
  Administered 2014-07-15 – 2014-07-21 (×5): 1000 mL
  Filled 2014-07-15 (×9): qty 1000

## 2014-07-15 MED ORDER — SODIUM CHLORIDE 0.9 % IV SOLN: INTRAVENOUS | Status: DC

## 2014-07-15 MED ORDER — VANCOMYCIN HCL IN DEXTROSE 1-5 GM/200ML-% IV SOLN
1000.0000 mg | Freq: Once | INTRAVENOUS | Status: AC
  Administered 2014-07-15: 1000 mg via INTRAVENOUS
  Filled 2014-07-15: qty 200

## 2014-07-15 MED ORDER — POTASSIUM CHLORIDE 10 MEQ/50ML IV SOLN
10.0000 meq | INTRAVENOUS | Status: AC
  Administered 2014-07-15 (×2): 10 meq via INTRAVENOUS
  Filled 2014-07-15 (×2): qty 50

## 2014-07-15 MED ORDER — PROPRANOLOL HCL 20 MG/5ML PO SOLN
40.0000 mg | Freq: Three times a day (TID) | ORAL | Status: DC
  Administered 2014-07-15 – 2014-07-29 (×39): 40 mg
  Filled 2014-07-15 (×46): qty 10

## 2014-07-15 NOTE — Progress Notes (Signed)
OT Cancellation Note  Patient Details Name: [name] LauberRhonda Bonfield MRN: [id number] DOB: 11/11/1960   Cancelled Treatment:    Reason Eval/Treat Not Completed: Other (comment).Spoke with PT who reported that RN stated pt not appropriate for OT at this time. Will hold at this time and f/u as appropriate.  Evette GeorgesLeonard, Benisha Hadaway Eva [phone] 07/15/2014, 10:47 AM

## 2014-07-15 NOTE — Progress Notes (Signed)
PT Cancellation Note  Patient Details Name: [name] LauberRhonda [name] MRN: [id number] DOB: 03/24/1961   Cancelled Treatment:    Reason Eval/Treat Not Completed: Medical issues which prohibited therapy.  Spoke with RN as pt not appropriate for PT at this time.  Will hold at this time and [name]/u as appropriate.     [name] [name], [name] [name] 07/15/2014, 9:40 AM
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
## 2014-07-15 NOTE — Progress Notes (Addendum)
Patient ID: [name] LauberRhonda [name], female   DOB: 12/27/1960, [age] y.o.   MRN: [id number] Follow up - Trauma Critical Care  Patient Details:    [name] [name] is an 53 y.o. female.  Lines/tubes : Airway 8 mm (Active)  Secured at (cm) 21 cm 07/15/2014  3:04 AM  Measured From Lips 07/15/2014  3:04 AM  Secured Location Right 07/15/2014  3:04 AM  Secured By [employer] Tube Holder 07/15/2014  3:04 AM  Tube Holder Repositioned Yes 07/15/2014  3:04 AM  Cuff Pressure (cm H2O) 24 cm H2O 07/14/2014  7:33 PM  Site Condition Dry 07/15/2014  3:04 AM     PICC / Midline Double Lumen 07/04/14 PICC Right Basilic 42 cm 4 cm (Active)  Indication for Insertion or Continuance of Line Prolonged intravenous therapies 07/14/2014  8:00 PM  Exposed Catheter (cm) 4 cm 07/04/2014 12:37 PM  Site Assessment Clean;Dry;Intact 07/14/2014  8:00 PM  Lumen #1 Status Infusing 07/14/2014  8:00 PM  Lumen #2 Status Infusing 07/14/2014  8:00 PM  Dressing Type Transparent;Occlusive 07/14/2014  8:00 PM  Dressing Status Clean;Dry;Intact;Antimicrobial disc in place 07/14/2014  8:00 PM  Line Care Connections checked and tightened 07/14/2014  8:00 PM  Dressing Intervention New dressing;Antimicrobial disc changed 07/11/2014  4:00 AM  Dressing Change Due 07/18/14 07/14/2014  8:00 PM     NG/OG Tube Orogastric 18 Fr. Left mouth (Active)  Placement Verification Auscultation 07/15/2014  4:00 AM  Site Assessment Clean;Dry;Intact 07/15/2014  4:00 AM  Status Infusing tube feed 07/15/2014  4:00 AM  Gastric Residual 0 mL 07/15/2014  4:00 AM     Urethral Catheter Beryle QuantMatt York, RN Latex 14 Fr. (Active)  Indication for Insertion or Continuance of Catheter Acute urinary retention 07/15/2014  8:00 AM  Site Assessment Clean;Intact;Dry 07/14/2014  8:00 PM  Catheter Maintenance Bag below level of bladder;Catheter secured;Drainage bag/tubing not touching floor;No dependent loops;Seal intact;Bag emptied prior to transport 07/15/2014  8:00 AM  Collection Container Standard drainage  bag 07/14/2014  8:00 PM  Securement Method Leg strap 07/14/2014  8:00 PM  Urinary Catheter Interventions Unclamped 07/14/2014  8:00 PM  Output (mL) 170 mL 07/15/2014  5:50 AM    Microbiology/Sepsis markers: Results for orders placed or performed during the hospital encounter of 06/29/14  Culture, respiratory (NON-Expectorated)     Status: None   Collection Time: 07/03/14  3:57 PM  Result Value Ref Range Status   Specimen Description TRACHEAL ASPIRATE  Final   Special Requests NONE  Final   Gram Stain   Final    MODERATE WBC PRESENT, PREDOMINANTLY PMN RARE SQUAMOUS EPITHELIAL CELLS PRESENT MODERATE GRAM POSITIVE COCCI IN PAIRS FEW GRAM NEGATIVE RODS Performed at [facility]    Culture   Final    ABUNDANT STAPHYLOCOCCUS AUREUS Note: RIFAMPIN AND GENTAMICIN SHOULD NOT BE USED AS SINGLE DRUGS FOR TREATMENT OF STAPH INFECTIONS. Performed at [facility]    Report Status 07/06/2014 FINAL  Final   Organism ID, Bacteria STAPHYLOCOCCUS AUREUS  Final      Susceptibility   Staphylococcus aureus - MIC*    CLINDAMYCIN <=0.25 SENSITIVE Sensitive     ERYTHROMYCIN <=0.25 SENSITIVE Sensitive     GENTAMICIN <=0.5 SENSITIVE Sensitive     LEVOFLOXACIN <=0.12 SENSITIVE Sensitive     OXACILLIN <=0.25 SENSITIVE Sensitive     PENICILLIN >=0.5 RESISTANT Resistant     RIFAMPIN <=0.5 SENSITIVE Sensitive     TRIMETH/SULFA <=10 SENSITIVE Sensitive     VANCOMYCIN 1 SENSITIVE Sensitive     TETRACYCLINE <=1  SENSITIVE Sensitive     MOXIFLOXACIN <=0.25 SENSITIVE Sensitive     * ABUNDANT STAPHYLOCOCCUS AUREUS  Urine culture     Status: None   Collection Time: 07/03/14  4:15 PM  Result Value Ref Range Status   Specimen Description URINE, CATHETERIZED  Final   Special Requests NONE  Final   Culture  Setup Time   Final    07/03/2014 23:13 Performed at [facility]   Final    >=100,000 COLONIES/ML Performed at [facility]    Culture   Final    ESCHERICHIA  COLI Performed at [facility]    Report Status 07/06/2014 FINAL  Final   Organism ID, Bacteria ESCHERICHIA COLI  Final      Susceptibility   Escherichia coli - MIC*    AMPICILLIN >=32 RESISTANT Resistant     CEFAZOLIN <=4 SENSITIVE Sensitive     CEFTRIAXONE <=1 SENSITIVE Sensitive     CIPROFLOXACIN <=0.25 SENSITIVE Sensitive     GENTAMICIN <=1 SENSITIVE Sensitive     LEVOFLOXACIN 1 SENSITIVE Sensitive     NITROFURANTOIN <=16 SENSITIVE Sensitive     TOBRAMYCIN <=1 SENSITIVE Sensitive     TRIMETH/SULFA >=320 RESISTANT Resistant     PIP/TAZO <=4 SENSITIVE Sensitive     * ESCHERICHIA COLI  Culture, blood (routine x 2)     Status: None   Collection Time: 07/03/14  8:30 PM  Result Value Ref Range Status   Specimen Description BLOOD RIGHT ARM  Final   Special Requests BOTTLES DRAWN AEROBIC AND ANAEROBIC 10CC  Final   Culture  Setup Time   Final    07/04/2014 01:42 Performed at [facility]    Culture   Final    NO GROWTH 5 DAYS Performed at [facility]    Report Status 07/10/2014 FINAL  Final  Culture, blood (routine x 2)     Status: None   Collection Time: 07/03/14  8:43 PM  Result Value Ref Range Status   Specimen Description BLOOD RIGHT FOREARM  Final   Special Requests BOTTLES DRAWN AEROBIC ONLY 4CC  Final   Culture  Setup Time   Final    07/04/2014 01:43 Performed at [facility]    Culture   Final    STAPHYLOCOCCUS SPECIES (COAGULASE NEGATIVE) Note: THE SIGNIFICANCE OF ISOLATING THIS ORGANISM FROM A SINGLE SET OF BLOOD CULTURES WHEN MULTIPLE SETS ARE DRAWN IS UNCERTAIN. PLEASE NOTIFY THE MICROBIOLOGY DEPARTMENT WITHIN ONE WEEK IF SPECIATION AND SENSITIVITIES ARE REQUIRED. Note: Gram Stain Report Called to,Read Back By and Verified With: ELLIE MESSER 07/05/14 0845 BY SMITHERSJ Performed at [facility]    Report Status 07/06/2014 FINAL  Final  Clostridium Difficile by PCR     Status: None   Collection Time: 07/07/14  6:25 AM   Result Value Ref Range Status   C difficile by pcr NEGATIVE NEGATIVE Final    Anti-infectives:  Anti-infectives    Start     Dose/Rate Route Frequency Ordered Stop   07/15/14 0800  vancomycin (VANCOCIN) IVPB 1000 mg/200 mL premix     1,000 mg200 mL/hr over 60 Minutes Intravenous Every 8 hours 07/15/14 0014     07/15/14 0600  piperacillin-tazobactam (ZOSYN) IVPB 3.375 g     3.375 g12.5 mL/hr over 240 Minutes Intravenous Every 8 hours 07/15/14 0014     07/15/14 0015  vancomycin (VANCOCIN) IVPB 1000 mg/200 mL premix     1,000 mg200 mL/hr over 60 Minutes  Intravenous  Once 07/15/14 0014 07/15/14 0230   07/15/14 0015  piperacillin-tazobactam (ZOSYN) IVPB 3.375 g     3.375 g100 mL/hr over 30 Minutes Intravenous  Once 07/15/14 0014 07/15/14 0159   07/07/14 1200  ceFAZolin (ANCEF) IVPB 1 g/50 mL premix  Status:  Discontinued     1 g100 mL/hr over 30 Minutes Intravenous Every 8 hours 07/07/14 1046 07/14/14 2352   07/04/14 1930  piperacillin-tazobactam (ZOSYN) IVPB 3.375 g  Status:  Discontinued     3.375 g12.5 mL/hr over 240 Minutes Intravenous Every 8 hours 07/04/14 1117 07/07/14 1044   07/04/14 1130  piperacillin-tazobactam (ZOSYN) IVPB 3.375 g     3.375 g100 mL/hr over 30 Minutes Intravenous  Once 07/04/14 1117 07/04/14 1300   07/04/14 1130  vancomycin (VANCOCIN) IVPB 1000 mg/200 mL premix  Status:  Discontinued     1,000 mg200 mL/hr over 60 Minutes Intravenous Every 12 hours 07/04/14 1117 07/07/14 1044      Best Practice/Protocols:  VTE Prophylaxis: Mechanical Continous Sedation  Consults: Treatment Team:  Tressie Stalker, MD Md Pccm, MD    Studies:    Events:  Subjective:    Overnight Issues:   Objective:  Vital signs for last 24 hours: Temp:  [98.1 F (36.7 C)-101.7 F (38.7 C)] 101.6 F (38.7 C) (12/07 0754) Pulse Rate:  [85-142] 109 (12/07 0754) Resp:  [16-37] 27 (12/07 0754) BP: (111-178)/(48-94) 135/69 mmHg (12/07 0754) SpO2:  [82 %-100 %] 98 % (12/07  0754) FiO2 (%):  [40 %-50 %] 40 % (12/07 0304) Weight:  [202 lb 13.2 oz (92 kg)] 202 lb 13.2 oz (92 kg) (12/07 0500)  Hemodynamic parameters for last 24 hours:    Intake/Output from previous day: 12/06 0701 - 12/07 0700 In: 3245.3 [I.V.:1285.8; NG/GT:129.5; IV Piggyback:150; TPN:1680] Out: 2210 [Urine:2210]  Intake/Output this shift:    Vent settings for last 24 hours: Vent Mode:  [-] AC FiO2 (%):  [40 %-50 %] 40 % Set Rate:  [22 bmp] 22 bmp Vt Set:  [460 mL] 460 mL PEEP:  [5 cmH20] 5 cmH20 Plateau Pressure:  [14 cmH20-17 cmH20] 15 cmH20  Physical Exam:  General: on vent Neuro: sedation just held, PERL, spont moves LE HEENT/Neck: ETT and sweating Resp: clear to auscultation bilaterally CVS: RRR GI: soft, NT, ND, +BS Extremities: warm  Results for orders placed or performed during the hospital encounter of 06/29/14 (from the past 24 hour(s))  Glucose, capillary     Status: Abnormal   Collection Time: 07/14/14 12:11 PM  Result Value Ref Range   Glucose-Capillary 181 (H) 70 - 99 mg/dL  Glucose, capillary     Status: Abnormal   Collection Time: 07/14/14  4:15 PM  Result Value Ref Range   Glucose-Capillary 207 (H) 70 - 99 mg/dL  Glucose, capillary     Status: Abnormal   Collection Time: 07/14/14  8:14 PM  Result Value Ref Range   Glucose-Capillary 191 (H) 70 - 99 mg/dL   Comment 1 Notify RN    Comment 2 Documented in Chart   I-STAT 3, arterial blood gas (G3+)     Status: Abnormal   Collection Time: 07/14/14 10:01 PM  Result Value Ref Range   pH, Arterial 7.457 (H) 7.350 - 7.450   pCO2 arterial 41.0 35.0 - 45.0 mmHg   pO2, Arterial 139.0 (H) 80.0 - 100.0 mmHg   Bicarbonate 29.0 (H) 20.0 - 24.0 mEq/L   TCO2 30 0 - 100 mmol/L   O2 Saturation 99.0 %   Acid-Base  Excess 5.0 (H) 0.0 - 2.0 mmol/L   Sample type ARTERIAL   Glucose, capillary     Status: Abnormal   Collection Time: 07/14/14 11:50 PM  Result Value Ref Range   Glucose-Capillary 221 (H) 70 - 99 mg/dL   Comprehensive metabolic panel     Status: Abnormal   Collection Time: 07/15/14  1:01 AM  Result Value Ref Range   Sodium 144 137 - 147 mEq/L   Potassium 3.2 (L) 3.7 - 5.3 mEq/L   Chloride 110 96 - 112 mEq/L   CO2 22 19 - 32 mEq/L   Glucose, Bld 239 (H) 70 - 99 mg/dL   BUN 25 (H) 6 - 23 mg/dL   Creatinine, Ser 1.61 (L) 0.50 - 1.10 mg/dL   Calcium 8.0 (L) 8.4 - 10.5 mg/dL   Total Protein 5.2 (L) 6.0 - 8.3 g/dL   Albumin 1.5 (L) 3.5 - 5.2 g/dL   AST 096 (H) 0 - 37 U/L   ALT 209 (H) 0 - 35 U/L   Alkaline Phosphatase 196 (H) 39 - 117 U/L   Total Bilirubin 2.1 (H) 0.3 - 1.2 mg/dL   GFR calc non Af Amer >90 >90 mL/min   GFR calc Af Amer >90 >90 mL/min   Anion gap 12 5 - 15  Magnesium     Status: None   Collection Time: 07/15/14  1:01 AM  Result Value Ref Range   Magnesium 2.0 1.5 - 2.5 mg/dL  Phosphorus     Status: None   Collection Time: 07/15/14  1:01 AM  Result Value Ref Range   Phosphorus 2.7 2.3 - 4.6 mg/dL  CBC     Status: Abnormal   Collection Time: 07/15/14  1:01 AM  Result Value Ref Range   WBC 5.2 4.0 - 10.5 K/uL   RBC 3.00 (L) 3.87 - 5.11 MIL/uL   Hemoglobin 9.3 (L) 12.0 - 15.0 g/dL   HCT 04.5 (L) 40.9 - 81.1 %   MCV 96.3 78.0 - 100.0 fL   MCH 31.0 26.0 - 34.0 pg   MCHC 32.2 30.0 - 36.0 g/dL   RDW 91.4 78.2 - 95.6 %   Platelets 62 (L) 150 - 400 K/uL  Differential     Status: Abnormal   Collection Time: 07/15/14  1:01 AM  Result Value Ref Range   Neutrophils Relative % 86 (H) 43 - 77 %   Neutro Abs 4.4 1.7 - 7.7 K/uL   Lymphocytes Relative 9 (L) 12 - 46 %   Lymphs Abs 0.4 (L) 0.7 - 4.0 K/uL   Monocytes Relative 5 3 - 12 %   Monocytes Absolute 0.3 0.1 - 1.0 K/uL   Eosinophils Relative 0 0 - 5 %   Eosinophils Absolute 0.0 0.0 - 0.7 K/uL   Basophils Relative 0 0 - 1 %   Basophils Absolute 0.0 0.0 - 0.1 K/uL  Triglycerides     Status: Abnormal   Collection Time: 07/15/14  1:01 AM  Result Value Ref Range   Triglycerides 271 (H) <150 mg/dL  Blood gas, arterial      Status: Abnormal   Collection Time: 07/15/14  3:09 AM  Result Value Ref Range   FIO2 0.40 %   Delivery systems VENTILATOR    Mode PRESSURE REGULATED VOLUME CONTROL    VT 460 mL   Rate 22 resp/min   Peep/cpap 5.0 cm H20   pH, Arterial 7.475 (H) 7.350 - 7.450   pCO2 arterial 34.2 (L) 35.0 - 45.0 mmHg  pO2, Arterial 144.0 (H) 80.0 - 100.0 mmHg   Bicarbonate 24.8 (H) 20.0 - 24.0 mEq/L   TCO2 25.9 0 - 100 mmol/L   Acid-Base Excess 1.5 0.0 - 2.0 mmol/L   O2 Saturation 98.9 %   Patient temperature 98.6    Collection site LEFT RADIAL    Drawn by [phone]    Sample type ARTERIAL DRAW    Allens test (pass/fail) PASS PASS  Glucose, capillary     Status: Abnormal   Collection Time: 07/15/14  4:44 AM  Result Value Ref Range   Glucose-Capillary 219 (H) 70 - 99 mg/dL   Comment 1 Notify RN    Comment 2 Documented in Chart   Glucose, capillary     Status: Abnormal   Collection Time: 07/15/14  7:51 AM  Result Value Ref Range   Glucose-Capillary 209 (H) 70 - 99 mg/dL    Assessment & Plan: Present on Admission:  . TBI (traumatic brain injury) . CVA (cerebral infarction)   LOS: 16 days   Additional comments:I reviewed the patient's new clinical lab test results. . MCC TBI w/ICC, skull fxs - per NS will require VP shunt, she seems to be neuro storming, add propranolol and D/C lopressor Multiple facial fxs Bilateral pulmonary contusions Vent dependent resp failure - appreciate CCM assist overnight and I D/W Dr. Molli KnockYacoub this AM. Will plan transition to pressure control, abg and RR are good. Likely will need trach later this week. ID - prev treated for e coli UTI and OSSA PNA, now on Vanc/Zosyn with new CX P CV - propranolol as above FEN -- TF. D/C TNA, add IVF with K+, supplement K+. CBG should come down off TNA. VTE -- SCD's Dispo -- ICU, I will speak with family once they come in to address their concerns and see if [facility] Total Time*: 32 Minutes  Violeta GelinasBurke  Larence Thone, MD, MPH, FACS Trauma: [phone] General Surgery: [phone]  07/15/2014  *Care during the described time interval was provided by me. I have reviewed this patient's available data, including medical history, events of note, physical examination and test results as part of my evaluation.

## 2014-07-15 NOTE — Clinical Social Work Note (Signed)
Clinical Social Worker provided step down CSW with handoff regarding patient needs and continued support.  Patient was reintubated and transferred back to intensive care unit.  This CSW to continue to follow for support and potential discharge needs.  Patient remains on the ventilator with plans to place a shunt later this week.  CSW remains available as needed.  [name] [name], [place] [phone]

## 2014-07-15 NOTE — Progress Notes (Signed)
Patient ID: [name], female   DOB: 09/12/1960, 53 y.o.   MRN: [id number] I spoke with the patient's daughter [name] [name] via the telephone. We discussed her mother's hydrocephalus. We discussed the various treatment options. I recommended placement of a ventriculoperitoneal shunt. I described the procedure to her. We discussed the risks including risks of anesthesia, infection, shunt malplacement or malfunction, seizures, abdominal injuries, medical risk etc. I have answered all her questions. She would like to discuss the situation with her aunt but is leaning towards consenting for the procedure. I will tentatively plan to do it later this week.

## 2014-07-15 NOTE — Progress Notes (Signed)
Inpatient Diabetes Program Recommendations  AACE/ADA: New Consensus Statement on Inpatient Glycemic Control (2013)  Target Ranges:  Prepandial:   less than 140 mg/dL      Peak postprandial:   less than 180 mg/dL (1-2 hours)      Critically ill patients:  140 - 180 mg/dL   Reason for Visit: Hyperglycemia  Diabetes history: None Outpatient Diabetes medications: None Current orders for Inpatient glycemic control: Novolog sensitive Q4H Results for [name] [name], [name] [name] (MRN [id number]) as of 07/15/2014 11:24  Ref. Range 07/14/2014 16:15 07/14/2014 20:14 07/14/2014 23:50 07/15/2014 04:44 07/15/2014 07:51  Glucose-Capillary Latest Range: 70-99 mg/dL 782207 (H) 956191 (H) 213221 (H) 219 (H) 209 (H)   CBGs consistently in the 200s.  Inpatient Diabetes Program Recommendations Correction (SSI): Increase Novolog to moderate Q4H Insulin - Meal Coverage: Consider addition of Novolog 3 units Q4H for TF coverage HgbA1C: 6.0% - No hx DM Diet: NPO  Note: Will continue to follow. Thank you. Ailene Ardshonda Kimyah Frein, RD, LDN, CDE Inpatient Diabetes Coordinator [phone]

## 2014-07-15 NOTE — Progress Notes (Signed)
Dr Molli KnockYacoub made vent changes as charted.  Pt tol well presently, no distress noted.

## 2014-07-15 NOTE — Progress Notes (Signed)
Patient ID: [name] [name], female   DOB: 04/18/1961, 53 y.o.   MRN: [id number] Subjective:  The patient is dilated and sedated on diprovan. She is in no apparent distress. I am told to the patient's daughter [facility]  Objective: Vital signs in last 24 hours: Temp:  [98.1 F (36.7 C)-102.5 F (39.2 C)] 99.4 F (37.4 C) (12/07 0400) Pulse Rate:  [85-142] 110 (12/07 0630) Resp:  [16-37] 28 (12/07 0630) BP: (111-178)/(48-94) 132/73 mmHg (12/07 0630) SpO2:  [82 %-100 %] 99 % (12/07 0630) FiO2 (%):  [40 %-50 %] 40 % (12/07 0304) Weight:  [92 kg (202 lb 13.2 oz)] 92 kg (202 lb 13.2 oz) (12/07 0500)  Intake/Output from previous day: 12/06 0701 - 12/07 0700 In: 3245.3 [I.V.:1285.8; NG/GT:129.5; IV Piggyback:150; TPN:1680] Out: 2210 [Urine:2210] Intake/Output this shift:    Physical exam Glasgow Coma Scale 10 intubated.. [id number]. She localizes bilaterally.  I have reviewed the patient's follow-up head CT performed this morning. It demonstrates resolving cerebral contusions and a resolving left epidural hematoma. 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  Lab Results:  Recent Labs  07/13/14 0440 07/15/14 0101  WBC 11.9* 5.2  HGB 10.9* 9.3*  HCT 33.2* 28.9*  PLT 50* 62*   BMET  Recent Labs  07/14/14 0428 07/15/14 0101  NA 147 144  K 3.9 3.2*  CL 110 110  CO2 25 22  GLUCOSE 194* 239*  BUN 18 25*  CREATININE 0.48* 0.45*  CALCIUM 8.2* 8.0*    Studies/Results: Ct Head Wo Contrast  07/15/2014   CLINICAL DATA:  Followup exam for hydrocephalus.  EXAM: CT HEAD WITHOUT CONTRAST  TECHNIQUE: Contiguous axial images were obtained from the base of the skull through the vertex without intravenous contrast.  COMPARISON:  Prior MRI and CT from 07/11/2014.  FINDINGS: Left-sided epidural hematoma is slightly decreased in size now measuring 5.5 mm in maximal thickness, previously 7.8 mm on 07/11/2014. Attenuation  within this collection is overall similar with no acute hemorrhage.  There has been continued interval evolution of hemorrhagic contusions within the bilateral frontal lobes and right temporal lobes. Associated edema about these contusions a is slightly decreased. No new or acute hemorrhage. There is perhaps trace residual hemorrhage along the left tentorium (series 2, image 12). Previous identified contusion at the right inferior colliculus not well appreciated.  No frank intraventricular hemorrhage identified. Ventricular size is stable with biventricular volume measuring 36 mm at the level of the frontal horns, previously 38 mm on 07/11/2014. No midline shift.  No acute large vessel territory infarct.  No new large vessel territory infarct. Left temporal bone and orbital fractures again seen, stable. The left mastoid air cells remain opacified with persistent opacity in the left middle ear cavity. Small amount of opacity layering within the posterior right mastoid air cells. Opacity within the sphenoid sinuses is similar to prior. Nasal bone and zygomatic arch fractures also grossly stable.  No new scalp soft tissue abnormality.  IMPRESSION: 1. Stable ventricular size as compared to 07/11/2014. No worsening hydrocephalus. 2. Slightly decreased size of left epidural hematoma now measuring 5.5 mm in maximal thickness, previously 8 mm. 3. Continued interval evolution of multi focal hemorrhagic contusions with decreased associated edema. No new intracranial hemorrhage. 4. Stable calvarial and facial fractures as previously described.   Electronically Signed   By: Rise MuBenjamin  McClintock M.D.   On: 07/15/2014 04:47   Dg Chest Port 1 View  07/14/2014   CLINICAL DATA:  Respiratory distress.  EXAM: PORTABLE CHEST - 1 VIEW  COMPARISON:  07/14/2014  FINDINGS: Endotracheal tube tip measures 4.6 cm above the carinal. Enteric tube tip is off the field of view but below the left hemidiaphragm. Right central venous PICC line  over the low SVC region. Normal heart size and pulmonary vascularity. Shallow inspiration with increasing atelectasis in the lung bases. Probable small right pleural effusion.  IMPRESSION: Shallow inspiration with increasing atelectasis in the lung bases and probable small right pleural effusion. Appliances are unchanged in position.   Electronically Signed   By: Burman NievesWilliam  Stevens M.D.   On: 07/14/2014 22:30   Dg Chest Port 1 View  07/14/2014   CLINICAL DATA:  Intubated  EXAM: 07/13/2014  COMPARISON:  None.  FINDINGS: Patient is rotated to the right. Endotracheal tube remains appropriately positioned. Nasogastric tube tip terminates below the level of the diaphragms but is not included in the field of view. Right-sided PICC line in place with tip over the cavoatrial junction. Heart size is mildly enlarged without evidence for edema. Linear left lower lobe atelectasis is identified. Trace pleural lesions.  IMPRESSION: Support apparatus as above, endotracheal tube appropriately positioned.   Electronically Signed   By: Christiana PellantGretchen  Green M.D.   On: 07/14/2014 14:06   Dg Chest Port 1 View  07/13/2014   CLINICAL DATA:  Intubated  EXAM: PORTABLE CHEST - 1 VIEW  COMPARISON:  07/13/2014 at 2:41 a.m.  FINDINGS: Endotracheal tube has been placed with tip 3 cm above the carina. Right-sided PICC line remains in place with tip over the mid to distal SVC. Cardiomegaly persists with probable posteriorly tracking small pleural effusions. Lungs are otherwise clear.  IMPRESSION: Endotracheal tube appropriately positioned.  No other change.   Electronically Signed   By: Christiana PellantGretchen  Green M.D.   On: 07/13/2014 18:09   Dg Abd Portable 1v  07/14/2014   CLINICAL DATA:  Orogastric tube placement  EXAM: PORTABLE ABDOMEN - 1 VIEW  COMPARISON:  None.  FINDINGS: The bowel gas pattern is normal. No radio-opaque calculi or other significant radiographic abnormality are seen. Residual barium noted in nondilated colon. NG tube is appropriately  positioned.  IMPRESSION: Nonobstructive bowel gas pattern. Nasogastric tube tip terminates over the expected distal stomach.   Electronically Signed   By: Christiana PellantGretchen  Green M.D.   On: 07/14/2014 14:07    Assessment/Plan: Traumatic brain injury, cerebral contusions, hydrocephalus, respiratory failure: The patient will likely need a ventriculoperitoneal shunt. I will plan to do it sometime this week if [facility].  LOS: 16 days     [name] [name] D 07/15/2014, 7:37 AM
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
## 2014-07-16 ENCOUNTER — Inpatient Hospital Stay (HOSPITAL_COMMUNITY): Payer: No Typology Code available for payment source

## 2014-07-16 DIAGNOSIS — S069X0D Unspecified intracranial injury without loss of consciousness, subsequent encounter: Secondary | ICD-10-CM

## 2014-07-16 DIAGNOSIS — J9601 Acute respiratory failure with hypoxia: Secondary | ICD-10-CM

## 2014-07-16 LAB — HEPATIC FUNCTION PANEL
ALBUMIN: 1.6 g/dL — AB (ref 3.5–5.2)
ALK PHOS: 162 U/L — AB (ref 39–117)
ALT: 117 U/L — ABNORMAL HIGH (ref 0–35)
AST: 40 U/L — ABNORMAL HIGH (ref 0–37)
BILIRUBIN INDIRECT: 0.4 mg/dL (ref 0.3–0.9)
Bilirubin, Direct: 0.2 mg/dL (ref 0.0–0.3)
TOTAL PROTEIN: 5.4 g/dL — AB (ref 6.0–8.3)
Total Bilirubin: 0.6 mg/dL (ref 0.3–1.2)

## 2014-07-16 LAB — MAGNESIUM: MAGNESIUM: 2.1 mg/dL (ref 1.5–2.5)

## 2014-07-16 LAB — BASIC METABOLIC PANEL
ANION GAP: 9 (ref 5–15)
BUN: 20 mg/dL (ref 6–23)
CO2: 27 mEq/L (ref 19–32)
Calcium: 8 mg/dL — ABNORMAL LOW (ref 8.4–10.5)
Chloride: 109 mEq/L (ref 96–112)
Creatinine, Ser: 0.39 mg/dL — ABNORMAL LOW (ref 0.50–1.10)
GFR calc non Af Amer: >90 mL/min (ref >90)
GLUCOSE: 200 mg/dL — AB (ref 70–99)
POTASSIUM: 4.1 meq/L (ref 3.7–5.3)
Sodium: 145 mEq/L (ref 137–147)

## 2014-07-16 LAB — BLOOD GAS, ARTERIAL
ACID-BASE EXCESS: 2.9 mmol/L — AB (ref 0.0–2.0)
BICARBONATE: 26.4 meq/L — AB (ref 20.0–24.0)
Drawn by: 36277
FIO2: 0.4 %
O2 Saturation: 98.9 %
PATIENT TEMPERATURE: 98.6
PEEP: 5 cmH2O
PO2 ART: 141 mmHg — AB (ref 80.0–100.0)
Pressure control: 15 cmH2O
RATE: 16 resp/min
TCO2: 27.6 mmol/L (ref 0–100)
pCO2 arterial: 37 mmHg (ref 35.0–45.0)
pH, Arterial: 7.467 — ABNORMAL HIGH (ref 7.350–7.450)

## 2014-07-16 LAB — PROTIME-INR
INR: 1.13 (ref 0.00–1.49)
Prothrombin Time: 14.7 seconds (ref 11.6–15.2)

## 2014-07-16 LAB — CBC
HCT: 28.5 % — ABNORMAL LOW (ref 36.0–46.0)
HEMOGLOBIN: 9 g/dL — AB (ref 12.0–15.0)
MCH: 30 pg (ref 26.0–34.0)
MCHC: 31.6 g/dL (ref 30.0–36.0)
MCV: 95 fL (ref 78.0–100.0)
PLATELETS: 90 10*3/uL — AB (ref 150–400)
RBC: 3 MIL/uL — ABNORMAL LOW (ref 3.87–5.11)
RDW: 15.5 % (ref 11.5–15.5)
WBC: 6.3 10*3/uL (ref 4.0–10.5)

## 2014-07-16 LAB — GLUCOSE, CAPILLARY
GLUCOSE-CAPILLARY: 192 mg/dL — AB (ref 70–99)
Glucose-Capillary: 163 mg/dL — ABNORMAL HIGH (ref 70–99)
Glucose-Capillary: 187 mg/dL — ABNORMAL HIGH (ref 70–99)
Glucose-Capillary: 201 mg/dL — ABNORMAL HIGH (ref 70–99)
Glucose-Capillary: 211 mg/dL — ABNORMAL HIGH (ref 70–99)
Glucose-Capillary: 222 mg/dL — ABNORMAL HIGH (ref 70–99)
Glucose-Capillary: 248 mg/dL — ABNORMAL HIGH (ref 70–99)

## 2014-07-16 LAB — PHOSPHORUS: PHOSPHORUS: 2.9 mg/dL (ref 2.3–4.6)

## 2014-07-16 MED ORDER — SODIUM CHLORIDE 0.9 % IJ SOLN
10.0000 mL | INTRAMUSCULAR | Status: DC | PRN
  Administered 2014-07-24 – 2014-07-25 (×3): 10 mL
  Filled 2014-07-16 (×3): qty 40

## 2014-07-16 MED ORDER — INSULIN ASPART 100 UNIT/ML ~~LOC~~ SOLN
0.0000 [IU] | SUBCUTANEOUS | Status: DC
  Administered 2014-07-16: 3 [IU] via SUBCUTANEOUS
  Administered 2014-07-16 (×3): 5 [IU] via SUBCUTANEOUS
  Administered 2014-07-17 (×3): 3 [IU] via SUBCUTANEOUS

## 2014-07-16 NOTE — Progress Notes (Signed)
Patient ID: [name], female   DOB: 04/02/1961, 53 y.o.   MRN: [id number] Blood CX with GPC. Will have PICC changed. Resp CX GNR, Continue Vanc/Zosyn pending ID of organisms. Violeta GelinasBurke Wilma Michaelson, MD, MPH, FACS Trauma: [phone] General Surgery: [phone]
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
## 2014-07-16 NOTE — Plan of Care (Signed)
Problem: Acute Rehab OT Goals (only OT should resolve) Goal: Pt. Will Transfer To Toilet Outcome: Not Applicable Date Met:  96/72/89 Goal: OT Additional ADL Goal #1 Outcome: Not Applicable Date Met:  79/15/04
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
## 2014-07-16 NOTE — Progress Notes (Signed)
Chaplain responded to message from RN that family requested prayer. Pt's sister [name] [name] was at bedside and said she was the one who had requested prayer. Pt is unresponsive but she did open her eyes a time or two during my visit. Through tears, pt's sister stated she had lost another sister last year and is very concerned about pt's condition. She expressed feeling that she is getting very vague answers from nurse about pt's condition. We had prayer together for pt and for medical staff. Pt's sister requested that unit chaplain pray for pt daily. I will relay that request.

## 2014-07-16 NOTE — Plan of Care (Signed)
Problem: Consults Goal: Severe Traumatic Brain Injury Patient Education See Patient Education Module for education specifics. Outcome: Completed/Met Date Met:  07/16/14 Given to pt family. Goal: Skin Care Protocol Initiated - if Braden Score 18 or less If consults are not indicated, leave blank or document N/A Outcome: Completed/Met Date Met:  07/16/14 Goal: Nutrition Consult-if indicated Outcome: Completed/Met Date Met:  07/16/14 Patient on tube feeds Goal: Diabetes Guidelines if Diabetic/Glucose > 140 If diabetic or lab glucose is > 140 mg/dl - Initiate Diabetes/Hyperglycemia Guidelines & Document Interventions  Outcome: Completed/Met Date Met:  07/16/14  Problem: Phase I Progression Outcomes Goal: Adequate ventilation/oxygenation Outcome: Completed/Met Date Met:  07/16/14 Pt on ventilator. Goal: Hemodynamically stable Outcome: Completed/Met Date Met:  07/16/14 Goal: Pain controlled with appropriate interventions Outcome: Completed/Met Date Met:  07/16/14 Goal: ICP less than 20 mmHg if monitored Outcome: Completed/Met Date Met:  07/16/14 Goal: Cervical/thoracic/lumbar spine cleared Outcome: Completed/Met Date Met:  07/16/14 Goal: Voiding-avoid urinary catheter unless indicated Outcome: Progressing  Problem: Phase II Progression Outcomes Goal: Consider tracheostomy Outcome: Completed/Met Date Met:  07/16/14 Plans to place on Thursday.  Consent in chart - telephone consent with daughter. Goal: Diet/tube feedings started Outcome: Completed/Met Date Met:  07/16/14

## 2014-07-16 NOTE — Progress Notes (Signed)
Wasted 200 ml of Fentanyl left in bag. Witnessed by Jordan/RN.

## 2014-07-16 NOTE — Progress Notes (Signed)
Patient ID: [name], female   DOB: 06/21/1961, 53 y.o.   MRN: [id number] I spoke with her daughter and discussed our recommendation to proceed with trach and PEG at the bedside Thursday. I reviewed the procedures, risks, and benefits. She agrees. Violeta GelinasBurke [name], MD, MPH, FACS Trauma: [phone] General Surgery: [phone]

## 2014-07-16 NOTE — Progress Notes (Signed)
Patient ID: Lajoyce LauberRhonda Dillow, female   DOB: 07/05/1961, 53 y.o.   MRN: [id number] I spoke with the patient's daughter, Archie Pattenonya. I answered all her questions. She wants to proceed with a VPS. I will plan to do it tomorrow afternoon.
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
## 2014-07-16 NOTE — Progress Notes (Signed)
Patient ID: [name] [name], female   DOB: [birth year], 53 y.o.   MRN: [id number] Follow up - Trauma Critical Care  Patient Details:    [name] [name] is an 53 y.o. female.  Lines/tubes : Airway 8 mm (Active)  Secured at (cm) 21 cm 07/16/2014  3:55 AM  Measured From Lips 07/16/2014  3:55 AM  Secured Location Center 07/16/2014  3:55 AM  Secured By [employer] 07/16/2014  3:55 AM  Tube Holder Repositioned Yes 07/16/2014  3:55 AM  Cuff Pressure (cm H2O) 24 cm H2O 07/15/2014  7:19 PM  Site Condition Dry 07/16/2014  3:55 AM     PICC / Midline Double Lumen 07/04/14 PICC Right Basilic 42 cm 4 cm (Active)  Indication for Insertion or Continuance of Line Prolonged intravenous therapies 07/16/2014  8:00 AM  Exposed Catheter (cm) 4 cm 07/04/2014 12:37 PM  Site Assessment Clean;Dry;Intact 07/16/2014  8:00 AM  Lumen #1 Status Infusing;Flushed;Blood return noted 07/16/2014  8:00 AM  Lumen #2 Status Infusing;Flushed;Blood return noted 07/16/2014  8:00 AM  Dressing Type Transparent 07/16/2014  8:00 AM  Dressing Status Clean;Dry;Intact;Antimicrobial disc in place 07/16/2014  8:00 AM  Line Care Connections checked and tightened 07/16/2014  8:00 AM  Dressing Intervention New dressing;Antimicrobial disc changed 07/11/2014  4:00 AM  Dressing Change Due 07/18/14 07/16/2014  8:00 AM     NG/OG Tube Orogastric 18 Fr. Left mouth (Active)  Placement Verification Auscultation 07/16/2014  8:00 AM  Site Assessment Clean;Dry;Intact 07/16/2014  8:00 AM  Status Infusing tube feed 07/16/2014  8:00 AM  Drainage Appearance Tan 07/16/2014  8:00 AM  Gastric Residual 125 mL 07/16/2014  8:00 AM     Urethral Catheter Beryle Quant, RN Latex 14 Fr. (Active)  Indication for Insertion or Continuance of Catheter Acute urinary retention 07/16/2014  8:00 AM  Site Assessment Clean;Intact 07/16/2014  8:00 AM  Catheter Maintenance Drainage bag/tubing not touching floor;Bag below level of bladder;Seal intact;No dependent loops;Catheter secured;Bag  emptied prior to transport 07/16/2014  8:00 AM  Collection Container Standard drainage bag 07/16/2014  8:00 AM  Securement Method Securing device (Describe) 07/16/2014  8:00 AM  Urinary Catheter Interventions Unclamped 07/16/2014  8:00 AM  Output (mL) 225 mL 07/16/2014  8:00 AM    Microbiology/Sepsis markers: Results for orders placed or performed during the hospital encounter of 06/29/14  Culture, respiratory (NON-Expectorated)     Status: None   Collection Time: 07/03/14  3:57 PM  Result Value Ref Range Status   Specimen Description TRACHEAL ASPIRATE  Final   Special Requests NONE  Final   Gram Stain   Final    MODERATE WBC PRESENT, PREDOMINANTLY PMN RARE SQUAMOUS EPITHELIAL CELLS PRESENT MODERATE GRAM POSITIVE COCCI IN PAIRS FEW GRAM NEGATIVE RODS Performed at [facility]    Culture   Final    ABUNDANT STAPHYLOCOCCUS AUREUS Note: RIFAMPIN AND GENTAMICIN SHOULD NOT BE USED AS SINGLE DRUGS FOR TREATMENT OF STAPH INFECTIONS. Performed at [facility]    Report Status 07/06/2014 FINAL  Final   Organism ID, Bacteria STAPHYLOCOCCUS AUREUS  Final      Susceptibility   Staphylococcus aureus - MIC*    CLINDAMYCIN <=0.25 SENSITIVE Sensitive     ERYTHROMYCIN <=0.25 SENSITIVE Sensitive     GENTAMICIN <=0.5 SENSITIVE Sensitive     LEVOFLOXACIN <=0.12 SENSITIVE Sensitive     OXACILLIN <=0.25 SENSITIVE Sensitive     PENICILLIN >=0.5 RESISTANT Resistant     RIFAMPIN <=0.5 SENSITIVE Sensitive     TRIMETH/SULFA <=10 SENSITIVE Sensitive  VANCOMYCIN 1 SENSITIVE Sensitive     TETRACYCLINE <=1 SENSITIVE Sensitive     MOXIFLOXACIN <=0.25 SENSITIVE Sensitive     * ABUNDANT STAPHYLOCOCCUS AUREUS  Urine culture     Status: None   Collection Time: 07/03/14  4:15 PM  Result Value Ref Range Status   Specimen Description URINE, CATHETERIZED  Final   Special Requests NONE  Final   Culture  Setup Time   Final    07/03/2014 23:13 Performed at [facility]    Colony Count    Final    >=100,000 COLONIES/ML Performed at [facility]    Culture   Final    ESCHERICHIA COLI Performed at [facility]    Report Status 07/06/2014 FINAL  Final   Organism ID, Bacteria ESCHERICHIA COLI  Final      Susceptibility   Escherichia coli - MIC*    AMPICILLIN >=32 RESISTANT Resistant     CEFAZOLIN <=4 SENSITIVE Sensitive     CEFTRIAXONE <=1 SENSITIVE Sensitive     CIPROFLOXACIN <=0.25 SENSITIVE Sensitive     GENTAMICIN <=1 SENSITIVE Sensitive     LEVOFLOXACIN 1 SENSITIVE Sensitive     NITROFURANTOIN <=16 SENSITIVE Sensitive     TOBRAMYCIN <=1 SENSITIVE Sensitive     TRIMETH/SULFA >=320 RESISTANT Resistant     PIP/TAZO <=4 SENSITIVE Sensitive     * ESCHERICHIA COLI  Culture, blood (routine x 2)     Status: None   Collection Time: 07/03/14  8:30 PM  Result Value Ref Range Status   Specimen Description BLOOD RIGHT ARM  Final   Special Requests BOTTLES DRAWN AEROBIC AND ANAEROBIC 10CC  Final   Culture  Setup Time   Final    07/04/2014 01:42 Performed at [facility]    Culture   Final    NO GROWTH 5 DAYS Performed at [facility]    Report Status 07/10/2014 FINAL  Final  Culture, blood (routine x 2)     Status: None   Collection Time: 07/03/14  8:43 PM  Result Value Ref Range Status   Specimen Description BLOOD RIGHT FOREARM  Final   Special Requests BOTTLES DRAWN AEROBIC ONLY 4CC  Final   Culture  Setup Time   Final    07/04/2014 01:43 Performed at [facility]    Culture   Final    STAPHYLOCOCCUS SPECIES (COAGULASE NEGATIVE) Note: THE SIGNIFICANCE OF ISOLATING THIS ORGANISM FROM A SINGLE SET OF BLOOD CULTURES WHEN MULTIPLE SETS ARE DRAWN IS UNCERTAIN. PLEASE NOTIFY THE MICROBIOLOGY DEPARTMENT WITHIN ONE WEEK IF SPECIATION AND SENSITIVITIES ARE REQUIRED. Note: Gram Stain Report Called to,Read Back By and Verified With: ELLIE MESSER 07/05/14 0845 BY SMITHERSJ Performed at [facility]    Report Status  07/06/2014 FINAL  Final  Clostridium Difficile by PCR     Status: None   Collection Time: 07/07/14  6:25 AM  Result Value Ref Range Status   C difficile by pcr NEGATIVE NEGATIVE Final  Culture, Urine     Status: None   Collection Time: 07/15/14 12:17 AM  Result Value Ref Range Status   Specimen Description URINE, CATHETERIZED  Final   Special Requests NONE  Final   Culture  Setup Time   Final    07/15/2014 01:02 Performed at [facility]    Colony Count NO GROWTH Performed at [facility]   Final   Culture NO GROWTH Performed at [facility]   Final   Report Status 07/15/2014  FINAL  Final  Culture, blood (single)     Status: None (Preliminary result)   Collection Time: 07/15/14  1:01 AM  Result Value Ref Range Status   Specimen Description BLOOD LEFT HAND  Final   Special Requests   Final    BOTTLES DRAWN AEROBIC AND ANAEROBIC 10CC BLUE 3CC PURPLE   Culture  Setup Time   Final    07/15/2014 10:09 Performed at [facility]    Culture   Final    GRAM POSITIVE COCCI IN PAIRS Performed at [facility]    Report Status PENDING  Incomplete  Culture, blood (single)     Status: None (Preliminary result)   Collection Time: 07/15/14  1:20 AM  Result Value Ref Range Status   Specimen Description BLOOD LEFT HAND  Final   Special Requests BOTTLES DRAWN AEROBIC ONLY 10CC  Final   Culture  Setup Time   Final    07/15/2014 10:09 Performed at [facility]    Culture   Final    GRAM POSITIVE COCCI IN PAIRS Note: Gram Stain Report Called to,Read Back By and Verified With: GABE SANTANELLA ON 07/15/2014 AT 9:54P BY WILEJ Performed at [facility]    Report Status PENDING  Incomplete  Clostridium Difficile by PCR     Status: None   Collection Time: 07/15/14 10:13 AM  Result Value Ref Range Status   C difficile by pcr NEGATIVE NEGATIVE Final    Anti-infectives:  Anti-infectives    Start     Dose/Rate Route Frequency Ordered  Stop   07/15/14 0800  vancomycin (VANCOCIN) IVPB 1000 mg/200 mL premix     1,000 mg200 mL/hr over 60 Minutes Intravenous Every 8 hours 07/15/14 0014     07/15/14 0600  piperacillin-tazobactam (ZOSYN) IVPB 3.375 g     3.375 g12.5 mL/hr over 240 Minutes Intravenous Every 8 hours 07/15/14 0014     07/15/14 0015  vancomycin (VANCOCIN) IVPB 1000 mg/200 mL premix     1,000 mg200 mL/hr over 60 Minutes Intravenous  Once 07/15/14 0014 07/15/14 0230   07/15/14 0015  piperacillin-tazobactam (ZOSYN) IVPB 3.375 g     3.375 g100 mL/hr over 30 Minutes Intravenous  Once 07/15/14 0014 07/15/14 0159   07/07/14 1200  ceFAZolin (ANCEF) IVPB 1 g/50 mL premix  Status:  Discontinued     1 g100 mL/hr over 30 Minutes Intravenous Every 8 hours 07/07/14 1046 07/14/14 2352   07/04/14 1930  piperacillin-tazobactam (ZOSYN) IVPB 3.375 g  Status:  Discontinued     3.375 g12.5 mL/hr over 240 Minutes Intravenous Every 8 hours 07/04/14 1117 07/07/14 1044   07/04/14 1130  piperacillin-tazobactam (ZOSYN) IVPB 3.375 g     3.375 g100 mL/hr over 30 Minutes Intravenous  Once 07/04/14 1117 07/04/14 1300   07/04/14 1130  vancomycin (VANCOCIN) IVPB 1000 mg/200 mL premix  Status:  Discontinued     1,000 mg200 mL/hr over 60 Minutes Intravenous Every 12 hours 07/04/14 1117 07/07/14 1044      Best Practice/Protocols:  VTE Prophylaxis: Mechanical Continous Sedation  Consults: Treatment Team:  Tressie Stalker, MD Md Pccm, MD    Studies:CXR - stable  Subjective:    Overnight Issues: no further storming  Objective:  Vital signs for last 24 hours: Temp:  [98.1 F (36.7 C)-100.6 F (38.1 C)] 98.8 F (37.1 C) (12/08 0800) Pulse Rate:  [51-92] 61 (12/08 0800) Resp:  [10-25] 18 (12/08 0800) BP: (120-169)/(65-109) 142/87 mmHg (12/08 0800) SpO2:  [88 %-100 %] 98 % (  12/08 0800) FiO2 (%):  [40 %] 40 % (12/08 0800) Weight:  [207 lb 14.3 oz (94.3 kg)] 207 lb 14.3 oz (94.3 kg) (12/08 0400)  Hemodynamic parameters for last 24  hours:    Intake/Output from previous day: 12/07 0701 - 12/08 0700 In: 2735.3 [I.V.:1040.8; NG/GT:404.5; IV Piggyback:1050; TPN:240] Out: 2300 [Urine:2300]  Intake/Output this shift: Total I/O In: 250 [I.V.:50; IV Piggyback:200] Out: 225 [Urine:225]  Vent settings for last 24 hours: Vent Mode:  [-] PCV FiO2 (%):  [40 %] 40 % Set Rate:  [16 bmp] 16 bmp PEEP:  [5 cmH20] 5 cmH20 Plateau Pressure:  [16 cmH20-18 cmH20] 17 cmH20  Physical Exam:  General: on vent Neuro: pupils react, opens eyes to voice, spont MVT LUE and BLE but not F/C HEENT/Neck: ETT Resp: clear to auscultation bilaterally CVS: RRR GI: soft, NT Extremities: edema 1+  Results for orders placed or performed during the hospital encounter of 06/29/14 (from the past 24 hour(s))  Clostridium Difficile by PCR     Status: None   Collection Time: 07/15/14 10:13 AM  Result Value Ref Range   C difficile by pcr NEGATIVE NEGATIVE  Blood gas, arterial     Status: Abnormal   Collection Time: 07/15/14 10:30 AM  Result Value Ref Range   FIO2 0.40 %   Delivery systems VENTILATOR    Mode PRESSURE CONTROL    Rate 16 resp/min   Peep/cpap 5.0 cm H20   Pressure control 15 cm H20   pH, Arterial 7.469 (H) 7.350 - 7.450   pCO2 arterial 36.3 35.0 - 45.0 mmHg   pO2, Arterial 134.0 (H) 80.0 - 100.0 mmHg   Bicarbonate 26.0 (H) 20.0 - 24.0 mEq/L   TCO2 27.2 0 - 100 mmol/L   Acid-Base Excess 2.6 (H) 0.0 - 2.0 mmol/L   O2 Saturation 98.7 %   Patient temperature 98.6    Collection site LEFT RADIAL    Drawn by [phone]    Sample type ARTERIAL DRAW    Allens test (pass/fail) PASS PASS  Glucose, capillary     Status: Abnormal   Collection Time: 07/15/14 12:16 PM  Result Value Ref Range   Glucose-Capillary 222 (H) 70 - 99 mg/dL  Glucose, capillary     Status: Abnormal   Collection Time: 07/15/14  4:02 PM  Result Value Ref Range   Glucose-Capillary 207 (H) 70 - 99 mg/dL  Glucose, capillary     Status: Abnormal   Collection Time:  07/15/14  7:26 PM  Result Value Ref Range   Glucose-Capillary 196 (H) 70 - 99 mg/dL  Glucose, capillary     Status: Abnormal   Collection Time: 07/16/14 12:20 AM  Result Value Ref Range   Glucose-Capillary 163 (H) 70 - 99 mg/dL   Comment 1 Notify RN    Comment 2 Documented in Chart   Glucose, capillary     Status: Abnormal   Collection Time: 07/16/14  3:29 AM  Result Value Ref Range   Glucose-Capillary 211 (H) 70 - 99 mg/dL  Blood gas, arterial     Status: Abnormal   Collection Time: 07/16/14  3:43 AM  Result Value Ref Range   FIO2 0.40 %   Delivery systems VENTILATOR    Mode PRESSURE CONTROL    Rate 16 resp/min   Peep/cpap 5.0 cm H20   Pressure control 15 cm H20   pH, Arterial 7.467 (H) 7.350 - 7.450   pCO2 arterial 37.0 35.0 - 45.0 mmHg   pO2, Arterial 141.0 (H) 80.0 -  100.0 mmHg   Bicarbonate 26.4 (H) 20.0 - 24.0 mEq/L   TCO2 27.6 0 - 100 mmol/L   Acid-Base Excess 2.9 (H) 0.0 - 2.0 mmol/L   O2 Saturation 98.9 %   Patient temperature 98.6    Collection site LEFT RADIAL    Drawn by [phone]    Sample type ARTERIAL DRAW    Allens test (pass/fail) PASS PASS  CBC     Status: Abnormal   Collection Time: 07/16/14  5:00 AM  Result Value Ref Range   WBC 6.3 4.0 - 10.5 K/uL   RBC 3.00 (L) 3.87 - 5.11 MIL/uL   Hemoglobin 9.0 (L) 12.0 - 15.0 g/dL   HCT 60.4 (L) 54.0 - 98.1 %   MCV 95.0 78.0 - 100.0 fL   MCH 30.0 26.0 - 34.0 pg   MCHC 31.6 30.0 - 36.0 g/dL   RDW 19.1 47.8 - 29.5 %   Platelets 90 (L) 150 - 400 K/uL  Basic metabolic panel     Status: Abnormal   Collection Time: 07/16/14  5:00 AM  Result Value Ref Range   Sodium 145 137 - 147 mEq/L   Potassium 4.1 3.7 - 5.3 mEq/L   Chloride 109 96 - 112 mEq/L   CO2 27 19 - 32 mEq/L   Glucose, Bld 200 (H) 70 - 99 mg/dL   BUN 20 6 - 23 mg/dL   Creatinine, Ser 6.21 (L) 0.50 - 1.10 mg/dL   Calcium 8.0 (L) 8.4 - 10.5 mg/dL   GFR calc non Af Amer >90 >90 mL/min   GFR calc Af Amer >90 >90 mL/min   Anion gap 9 5 - 15  Hepatic  function panel     Status: Abnormal   Collection Time: 07/16/14  5:00 AM  Result Value Ref Range   Total Protein 5.4 (L) 6.0 - 8.3 g/dL   Albumin 1.6 (L) 3.5 - 5.2 g/dL   AST 40 (H) 0 - 37 U/L   ALT 117 (H) 0 - 35 U/L   Alkaline Phosphatase 162 (H) 39 - 117 U/L   Total Bilirubin 0.6 0.3 - 1.2 mg/dL   Bilirubin, Direct 0.2 0.0 - 0.3 mg/dL   Indirect Bilirubin 0.4 0.3 - 0.9 mg/dL  Magnesium     Status: None   Collection Time: 07/16/14  5:00 AM  Result Value Ref Range   Magnesium 2.1 1.5 - 2.5 mg/dL  Phosphorus     Status: None   Collection Time: 07/16/14  5:00 AM  Result Value Ref Range   Phosphorus 2.9 2.3 - 4.6 mg/dL  Glucose, capillary     Status: Abnormal   Collection Time: 07/16/14  8:07 AM  Result Value Ref Range   Glucose-Capillary 192 (H) 70 - 99 mg/dL    Assessment & Plan: Present on Admission:  . TBI (traumatic brain injury) . CVA (cerebral infarction)   LOS: 17 days   Additional comments:I reviewed the patient's new clinical lab test results. and CXR [facility] TBI w/ICC, skull fxs - Dr. Lovell Sheehan plans VP shunt tomorrow, propranolol has helped storming Multiple facial fxs Bilateral pulmonary contusions Vent dependent resp failure - ABG OK, begin weaning but will not extubate. Likely will need trach later this week. ID - prev treated for e coli UTI and OSSA PNA, now on Vanc/Zosyn with new CX P CV - propranolol  FEN -- TF. CBG remain elevated so will adjust SSI VTE -- SCD's Dispo -- ICU, I will speak with family regarding likely need for trach and  PEG later this week Critical Care Total Time*: 32 Minutes  Violeta GelinasBurke Tajanay Hurley, MD, MPH, [facility] Trauma: [phone] General Surgery: [phone]  07/16/2014  *Care during the described time interval was provided by me. I have reviewed this patient's available data, including medical history, events of note, physical examination and test results as part of my evaluation.

## 2014-07-16 NOTE — Progress Notes (Signed)
Peripherally Inserted Central Catheter/Midline Placement  The IV Nurse has discussed with the patient and/or persons authorized to consent for the patient, the purpose of this procedure and the potential benefits and risks involved with this procedure.  The benefits include less needle sticks, lab draws from the catheter and patient may be discharged home with the catheter.  Risks include, but not limited to, infection, bleeding, blood clot (thrombus formation), and puncture of an artery; nerve damage and irregular heat beat.  Alternatives to this procedure were also discussed.  PICC/Midline Placement Documentation  PICC / Midline Double Lumen 07/04/14 PICC Right Basilic 42 cm 4 cm (Active)  Indication for Insertion or Continuance of Line Prolonged intravenous therapies 07/16/2014  8:00 AM  Exposed Catheter (cm) 4 cm 07/04/2014 12:37 PM  Site Assessment Clean;Dry;Intact 07/16/2014  8:00 AM  Lumen #1 Status Infusing;Flushed;Blood return noted 07/16/2014  8:00 AM  Lumen #2 Status Infusing;Flushed;Blood return noted 07/16/2014  8:00 AM  Dressing Type Transparent 07/16/2014  8:00 AM  Dressing Status Clean;Dry;Intact;Antimicrobial disc in place 07/16/2014  8:00 AM  Line Care Connections checked and tightened 07/16/2014  8:00 AM  Dressing Intervention New dressing;Antimicrobial disc changed 07/11/2014  4:00 AM  Dressing Change Due 07/18/14 07/16/2014  8:00 AM     PICC / Midline Double Lumen 07/16/14 PICC Left Basilic 48 cm 0 cm (Active)  Indication for Insertion or Continuance of Line Prolonged intravenous therapies 07/16/2014  6:23 PM  Exposed Catheter (cm) 0 cm 07/16/2014  6:23 PM  Dressing Change Due 07/23/14 07/16/2014  6:23 PM       Aditri Louischarles, Anderson MaltaJessica M 07/16/2014, 6:25 PM

## 2014-07-16 NOTE — Progress Notes (Signed)
Patient ID: [name], female   DOB: 03/22/1961, 53 y.o.   MRN: [id number] I spoke with her sister at the bedside and updated her on the plan of care including likely trach and PEG this Thursday. I will also D/W her daughter. Violeta GelinasBurke [name] Erker, MD, MPH, FACS Trauma: [phone] General Surgery: [phone]

## 2014-07-17 ENCOUNTER — Inpatient Hospital Stay (HOSPITAL_COMMUNITY): Payer: No Typology Code available for payment source

## 2014-07-17 ENCOUNTER — Encounter (HOSPITAL_COMMUNITY): Admission: EM | Disposition: A | Discharge status: Rehabilitation Facility | Payer: Self-pay | Source: Home / Self Care

## 2014-07-17 ENCOUNTER — Inpatient Hospital Stay (HOSPITAL_COMMUNITY): Payer: No Typology Code available for payment source | Admitting: Critical Care Medicine

## 2014-07-17 ENCOUNTER — Encounter (HOSPITAL_COMMUNITY): Payer: Self-pay | Admitting: Certified Registered"

## 2014-07-17 HISTORY — PX: VENTRICULOPERITONEAL SHUNT: SHX204

## 2014-07-17 LAB — BLOOD GAS, ARTERIAL
Acid-Base Excess: 4.4 mmol/L — ABNORMAL HIGH (ref 0.0–2.0)
Bicarbonate: 28 mEq/L — ABNORMAL HIGH (ref 20.0–24.0)
Drawn by: 36277
FIO2: 0.4 %
LHR: 16 {breaths}/min
O2 Saturation: 99.2 %
PATIENT TEMPERATURE: 100
PEEP: 5 cmH2O
PRESSURE CONTROL: 10 cmH2O
TCO2: 29.2 mmol/L (ref 0–100)
pCO2 arterial: 40.6 mmHg (ref 35.0–45.0)
pH, Arterial: 7.457 — ABNORMAL HIGH (ref 7.350–7.450)
pO2, Arterial: 137 mmHg — ABNORMAL HIGH (ref 80.0–100.0)

## 2014-07-17 LAB — VANCOMYCIN, TROUGH: Vancomycin Tr: 10.5 ug/mL (ref 10.0–20.0)

## 2014-07-17 LAB — CULTURE, RESPIRATORY

## 2014-07-17 LAB — CBC
HEMATOCRIT: 29.6 % — AB (ref 36.0–46.0)
Hemoglobin: 9.6 g/dL — ABNORMAL LOW (ref 12.0–15.0)
MCH: 31.3 pg (ref 26.0–34.0)
MCHC: 32.4 g/dL (ref 30.0–36.0)
MCV: 96.4 fL (ref 78.0–100.0)
Platelets: 155 10*3/uL (ref 150–400)
RBC: 3.07 MIL/uL — ABNORMAL LOW (ref 3.87–5.11)
RDW: 15.1 % (ref 11.5–15.5)
WBC: 10.5 10*3/uL (ref 4.0–10.5)

## 2014-07-17 LAB — BASIC METABOLIC PANEL
Anion gap: 9 (ref 5–15)
BUN: 20 mg/dL (ref 6–23)
CALCIUM: 8.3 mg/dL — AB (ref 8.4–10.5)
CO2: 28 mEq/L (ref 19–32)
CREATININE: 0.41 mg/dL — AB (ref 0.50–1.10)
Chloride: 106 mEq/L (ref 96–112)
GFR calc Af Amer: >90 mL/min (ref >90)
GLUCOSE: 195 mg/dL — AB (ref 70–99)
Potassium: 4 mEq/L (ref 3.7–5.3)
Sodium: 143 mEq/L (ref 137–147)

## 2014-07-17 LAB — GLUCOSE, CAPILLARY
GLUCOSE-CAPILLARY: 180 mg/dL — AB (ref 70–99)
GLUCOSE-CAPILLARY: 179 mg/dL — AB (ref 70–99)
GLUCOSE-CAPILLARY: 162 mg/dL — AB (ref 70–99)
Glucose-Capillary: 151 mg/dL — ABNORMAL HIGH (ref 70–99)
Glucose-Capillary: 195 mg/dL — ABNORMAL HIGH (ref 70–99)

## 2014-07-17 LAB — CULTURE, RESPIRATORY W GRAM STAIN

## 2014-07-17 LAB — MAGNESIUM: Magnesium: 2 mg/dL (ref 1.5–2.5)

## 2014-07-17 LAB — PHOSPHORUS: Phosphorus: 2.9 mg/dL (ref 2.3–4.6)

## 2014-07-17 SURGERY — SHUNT INSERTION VENTRICULAR-PERITONEAL
Anesthesia: General | Site: Head | Laterality: Right

## 2014-07-17 MED ORDER — PANTOPRAZOLE SODIUM 40 MG IV SOLR: 40.0000 mg | Freq: Every day | INTRAVENOUS | Status: DC

## 2014-07-17 MED ORDER — ACETAMINOPHEN 650 MG RE SUPP: 650.0000 mg | RECTAL | Status: DC | PRN

## 2014-07-17 MED ORDER — PROPOFOL 10 MG/ML IV BOLUS
INTRAVENOUS | Status: DC | PRN
  Administered 2014-07-17: 50 mg via INTRAVENOUS

## 2014-07-17 MED ORDER — VANCOMYCIN HCL 10 G IV SOLR
1500.0000 mg | Freq: Three times a day (TID) | INTRAVENOUS | Status: DC
  Filled 2014-07-17: qty 1500

## 2014-07-17 MED ORDER — ONDANSETRON HCL 4 MG/2ML IJ SOLN
4.0000 mg | INTRAMUSCULAR | Status: DC | PRN
Start: 2014-07-17 — End: 2014-07-29

## 2014-07-17 MED ORDER — PROMETHAZINE HCL 25 MG PO TABS: 12.5000 mg | ORAL_TABLET | ORAL | Status: DC | PRN

## 2014-07-17 MED ORDER — MIDAZOLAM HCL 5 MG/5ML IJ SOLN
INTRAMUSCULAR | Status: DC | PRN
  Administered 2014-07-17: 2 mg via INTRAVENOUS

## 2014-07-17 MED ORDER — THROMBIN 5000 UNITS EX SOLR
CUTANEOUS | Status: DC | PRN
  Administered 2014-07-17 (×2): 5000 [IU] via TOPICAL

## 2014-07-17 MED ORDER — FENTANYL CITRATE 0.05 MG/ML IJ SOLN
INTRAMUSCULAR | Status: DC | PRN
  Administered 2014-07-17 (×3): 50 ug via INTRAVENOUS

## 2014-07-17 MED ORDER — LABETALOL HCL 5 MG/ML IV SOLN
10.0000 mg | INTRAVENOUS | Status: DC | PRN
  Administered 2014-07-27: 20 mg via INTRAVENOUS
  Filled 2014-07-17: qty 8

## 2014-07-17 MED ORDER — HEMOSTATIC AGENTS (NO CHARGE) OPTIME
TOPICAL | Status: DC | PRN
  Administered 2014-07-17: 1 via TOPICAL

## 2014-07-17 MED ORDER — BUPIVACAINE-EPINEPHRINE 0.5% -1:200000 IJ SOLN
INTRAMUSCULAR | Status: DC | PRN
  Administered 2014-07-17: 10 mL

## 2014-07-17 MED ORDER — MORPHINE SULFATE 2 MG/ML IJ SOLN: 1.0000 mg | INTRAMUSCULAR | Status: DC | PRN

## 2014-07-17 MED ORDER — BACITRACIN ZINC 500 UNIT/GM EX OINT
TOPICAL_OINTMENT | CUTANEOUS | Status: DC | PRN
  Administered 2014-07-17: 1 via TOPICAL

## 2014-07-17 MED ORDER — ACETAMINOPHEN 325 MG PO TABS
650.0000 mg | ORAL_TABLET | ORAL | Status: DC | PRN
  Administered 2014-07-18: 650 mg via ORAL
  Filled 2014-07-17: qty 2

## 2014-07-17 MED ORDER — MIDAZOLAM HCL 2 MG/2ML IJ SOLN
INTRAMUSCULAR | Status: AC
  Filled 2014-07-17: qty 2

## 2014-07-17 MED ORDER — INSULIN ASPART 100 UNIT/ML ~~LOC~~ SOLN: 0.0000 [IU] | Freq: Three times a day (TID) | SUBCUTANEOUS | Status: DC

## 2014-07-17 MED ORDER — CEFTRIAXONE SODIUM IN DEXTROSE 40 MG/ML IV SOLN
2.0000 g | INTRAVENOUS | Status: AC
  Administered 2014-07-17 – 2014-07-25 (×9): 2 g via INTRAVENOUS
  Filled 2014-07-17 (×9): qty 50

## 2014-07-17 MED ORDER — ONDANSETRON HCL 4 MG PO TABS: 4.0000 mg | ORAL_TABLET | ORAL | Status: DC | PRN

## 2014-07-17 MED ORDER — FENTANYL CITRATE 0.05 MG/ML IJ SOLN
INTRAMUSCULAR | Status: AC
  Filled 2014-07-17: qty 5

## 2014-07-17 MED ORDER — INSULIN ASPART 100 UNIT/ML ~~LOC~~ SOLN
0.0000 [IU] | SUBCUTANEOUS | Status: DC
  Administered 2014-07-17: 4 [IU] via SUBCUTANEOUS
  Administered 2014-07-18 – 2014-07-20 (×11): 3 [IU] via SUBCUTANEOUS
  Administered 2014-07-20: 4 [IU] via SUBCUTANEOUS
  Administered 2014-07-20 – 2014-07-21 (×4): 3 [IU] via SUBCUTANEOUS
  Administered 2014-07-21: 4 [IU] via SUBCUTANEOUS
  Administered 2014-07-21 – 2014-07-22 (×5): 3 [IU] via SUBCUTANEOUS
  Administered 2014-07-22 – 2014-07-23 (×2): 4 [IU] via SUBCUTANEOUS
  Administered 2014-07-23 (×3): 3 [IU] via SUBCUTANEOUS
  Administered 2014-07-23: 4 [IU] via SUBCUTANEOUS
  Administered 2014-07-24 (×4): 3 [IU] via SUBCUTANEOUS
  Administered 2014-07-24 (×2): 4 [IU] via SUBCUTANEOUS
  Administered 2014-07-25 (×3): 3 [IU] via SUBCUTANEOUS
  Administered 2014-07-25 (×2): 4 [IU] via SUBCUTANEOUS
  Administered 2014-07-25 – 2014-07-27 (×11): 3 [IU] via SUBCUTANEOUS
  Administered 2014-07-27: 4 [IU] via SUBCUTANEOUS
  Administered 2014-07-27 – 2014-07-28 (×2): 3 [IU] via SUBCUTANEOUS
  Administered 2014-07-28: 4 [IU] via SUBCUTANEOUS
  Administered 2014-07-28 – 2014-07-29 (×7): 3 [IU] via SUBCUTANEOUS

## 2014-07-17 MED ORDER — ROCURONIUM BROMIDE 100 MG/10ML IV SOLN
INTRAVENOUS | Status: DC | PRN
  Administered 2014-07-17 (×2): 50 mg via INTRAVENOUS

## 2014-07-17 MED ORDER — PROPOFOL 10 MG/ML IV BOLUS
INTRAVENOUS | Status: AC
  Filled 2014-07-17: qty 20

## 2014-07-17 MED ORDER — ROCURONIUM BROMIDE 50 MG/5ML IV SOLN
INTRAVENOUS | Status: AC
  Filled 2014-07-17: qty 1

## 2014-07-17 MED ORDER — SODIUM CHLORIDE 0.9 % IR SOLN
Status: DC | PRN
  Administered 2014-07-17: 500 mL

## 2014-07-17 SURGICAL SUPPLY — 64 items
BAG DECANTER FOR FLEXI CONT (MISCELLANEOUS) ×3 IMPLANT
BENZOIN TINCTURE PRP APPL 2/3 (GAUZE/BANDAGES/DRESSINGS) ×3 IMPLANT
BLADE CLIPPER SURG (BLADE) ×6 IMPLANT
BOOT SUTURE AID YELLOW STND (SUTURE) ×3 IMPLANT
BRUSH SCRUB EZ 1% IODOPHOR (MISCELLANEOUS) ×3 IMPLANT
BUR ACORN 6.0 PRECISION (BURR) ×2 IMPLANT
BUR ACORN 6.0MM PRECISION (BURR) ×1
CANISTER SUCT 3000ML (MISCELLANEOUS) ×3 IMPLANT
CLIP RANEY DISP (INSTRUMENTS) IMPLANT
CLOSURE WOUND 1/2 X4 (GAUZE/BANDAGES/DRESSINGS) ×1
CONT SPEC 4OZ CLIKSEAL STRL BL (MISCELLANEOUS) IMPLANT
DRAPE INCISE IOBAN 85X60 (DRAPES) ×3 IMPLANT
DRAPE ORTHO SPLIT 77X108 STRL (DRAPES) ×4
DRAPE POUCH INSTRU U-SHP 10X18 (DRAPES) ×3 IMPLANT
DRAPE SURG 17X23 STRL (DRAPES) ×18 IMPLANT
DRAPE SURG ORHT 6 SPLT 77X108 (DRAPES) ×2 IMPLANT
DRSG TELFA 3X8 NADH (GAUZE/BANDAGES/DRESSINGS) IMPLANT
ELECT REM PT RETURN 9FT ADLT (ELECTROSURGICAL) ×3
ELECTRODE REM PT RTRN 9FT ADLT (ELECTROSURGICAL) ×1 IMPLANT
GAUZE SPONGE 4X4 12PLY STRL (GAUZE/BANDAGES/DRESSINGS) ×3 IMPLANT
GAUZE SPONGE 4X4 16PLY XRAY LF (GAUZE/BANDAGES/DRESSINGS) IMPLANT
GLOVE BIO SURGEON STRL SZ 6.5 (GLOVE) ×4 IMPLANT
GLOVE BIO SURGEON STRL SZ7 (GLOVE) ×6 IMPLANT
GLOVE BIO SURGEON STRL SZ8.5 (GLOVE) ×3 IMPLANT
GLOVE BIO SURGEONS STRL SZ 6.5 (GLOVE) ×2
GLOVE BIOGEL PI IND STRL 6.5 (GLOVE) ×1 IMPLANT
GLOVE BIOGEL PI INDICATOR 6.5 (GLOVE) ×2
GLOVE EXAM NITRILE LRG STRL (GLOVE) IMPLANT
GLOVE EXAM NITRILE MD LF STRL (GLOVE) IMPLANT
GLOVE EXAM NITRILE XL STR (GLOVE) IMPLANT
GLOVE EXAM NITRILE XS STR PU (GLOVE) IMPLANT
GLOVE SS BIOGEL STRL SZ 8 (GLOVE) ×1 IMPLANT
GLOVE SUPERSENSE BIOGEL SZ 8 (GLOVE) ×2
GOWN STRL REUS W/ TWL LRG LVL3 (GOWN DISPOSABLE) IMPLANT
GOWN STRL REUS W/ TWL XL LVL3 (GOWN DISPOSABLE) IMPLANT
GOWN STRL REUS W/TWL LRG LVL3 (GOWN DISPOSABLE)
GOWN STRL REUS W/TWL XL LVL3 (GOWN DISPOSABLE)
KIT BASIN OR (CUSTOM PROCEDURE TRAY) ×3 IMPLANT
KIT ROOM TURNOVER OR (KITS) ×3 IMPLANT
NEEDLE HYPO 22GX1.5 SAFETY (NEEDLE) ×3 IMPLANT
NS IRRIG 1000ML POUR BTL (IV SOLUTION) ×3 IMPLANT
PACK LAMINECTOMY NEURO (CUSTOM PROCEDURE TRAY) ×3 IMPLANT
PAD ARMBOARD 7.5X6 YLW CONV (MISCELLANEOUS) ×9 IMPLANT
SHEATH PERITONEAL INTRO 46 (MISCELLANEOUS) IMPLANT
SHEATH PERITONEAL INTRO 61 (MISCELLANEOUS) IMPLANT
SPONGE LAP 4X18 X RAY DECT (DISPOSABLE) IMPLANT
STAPLER SKIN PROX WIDE 3.9 (STAPLE) ×3 IMPLANT
STRIP CLOSURE SKIN 1/2X4 (GAUZE/BANDAGES/DRESSINGS) ×2 IMPLANT
SUT ETHILON 3 0 PS 1 (SUTURE) ×3 IMPLANT
SUT NURALON 4 0 TR CR/8 (SUTURE) IMPLANT
SUT SILK 0 TIES 10X30 (SUTURE) IMPLANT
SUT SILK 3 0 SH 30 (SUTURE) ×3 IMPLANT
SUT VIC AB 1 CT1 18XBRD ANBCTR (SUTURE) ×1 IMPLANT
SUT VIC AB 1 CT1 8-18 (SUTURE) ×2
SUT VIC AB 2-0 CP2 18 (SUTURE) ×6 IMPLANT
SUT VIC AB 3-0 SH 8-18 (SUTURE) ×6 IMPLANT
SYR CONTROL 10ML LL (SYRINGE) ×3 IMPLANT
TAPE CLOTH SURG 4X10 WHT LF (GAUZE/BANDAGES/DRESSINGS) ×3 IMPLANT
TOWEL OR 17X24 6PK STRL BLUE (TOWEL DISPOSABLE) ×3 IMPLANT
TOWEL OR 17X26 10 PK STRL BLUE (TOWEL DISPOSABLE) ×3 IMPLANT
TRAY FOLEY CATH 14FRSI W/METER (CATHETERS) IMPLANT
UNDERPAD 30X30 INCONTINENT (UNDERPADS AND DIAPERS) ×3 IMPLANT
VALVE RT ANGLE UNITIZE DIST (Valve) ×3 IMPLANT
WATER STERILE IRR 1000ML POUR (IV SOLUTION) ×3 IMPLANT

## 2014-07-17 NOTE — Transfer of Care (Signed)
Immediate Anesthesia Transfer of Care Note  Patient: [name] LauberRhonda [name]  Procedure(s) Performed: Procedure(s): SHUNT INSERTION VENTRICULAR-PERITONEAL (Right)  Patient Location: ICU  Anesthesia Type:General  Level of Consciousness: Patient remains intubated per anesthesia plan  Airway & Oxygen Therapy: Patient remains intubated per anesthesia plan and Patient placed on Ventilator (see vital sign flow sheet for setting)  Post-op Assessment: Report given to PACU RN and Post -op Vital signs reviewed and stable  Post vital signs: Reviewed and stable  Complications: No apparent anesthesia complications

## 2014-07-17 NOTE — Plan of Care (Signed)
Problem: Consults Goal: Mild Traumatic Brain Injury Patient Education See Patient Education Module for education specifics.  Outcome: Completed/Met Date Met:  07/17/14

## 2014-07-17 NOTE — Progress Notes (Signed)
ANTIBIOTIC CONSULT NOTE - FOLLOW UP  Pharmacy Consult for vancomycin and Zosyn Indication: pneumonia  Not on File  Patient Measurements: Height: 5\' 8"  (172.7 cm) Weight: 207 lb 0.2 oz (93.9 kg) IBW/kg (Calculated) : 63.9   Vital Signs: Temp: 99 F (37.2 C) (12/09 0808) Temp Source: Axillary (12/09 0808) BP: 176/88 mmHg (12/09 0808) Pulse Rate: 62 (12/09 0808) Intake/Output from previous day: 12/08 0701 - 12/09 0700 In: 2610 [I.V.:1200; NG/GT:460; IV Piggyback:950] Out: 2135 [Urine:2135] Intake/Output from this shift: Total I/O In: 250 [I.V.:50; IV Piggyback:200] Out: -   Labs:  Recent Labs  07/15/14 0101 07/16/14 0500 07/17/14 0447  WBC 5.2 6.3 10.5  HGB 9.3* 9.0* 9.6*  PLT 62* 90* 155  CREATININE 0.45* 0.39* 0.41*   Estimated Creatinine Clearance: 98.6 mL/min (by C-G formula based on Cr of 0.41).  Recent Labs  07/17/14 0648  VANCOTROUGH 10.5     Microbiology: Recent Results (from the past 720 hour(s))  Culture, respiratory (NON-Expectorated)     Status: None   Collection Time: 07/03/14  3:57 PM  Result Value Ref Range Status   Specimen Description TRACHEAL ASPIRATE  Final   Special Requests NONE  Final   Gram Stain   Final    MODERATE WBC PRESENT, PREDOMINANTLY PMN RARE SQUAMOUS EPITHELIAL CELLS PRESENT MODERATE GRAM POSITIVE COCCI IN PAIRS FEW GRAM NEGATIVE RODS Performed at [facility]    Culture   Final    ABUNDANT STAPHYLOCOCCUS AUREUS Note: RIFAMPIN AND GENTAMICIN SHOULD NOT BE USED AS SINGLE DRUGS FOR TREATMENT OF STAPH INFECTIONS. Performed at [facility]    Report Status 07/06/2014 FINAL  Final   Organism ID, Bacteria STAPHYLOCOCCUS AUREUS  Final      Susceptibility   Staphylococcus aureus - MIC*    CLINDAMYCIN <=0.25 SENSITIVE Sensitive     ERYTHROMYCIN <=0.25 SENSITIVE Sensitive     GENTAMICIN <=0.5 SENSITIVE Sensitive     LEVOFLOXACIN <=0.12 SENSITIVE Sensitive     OXACILLIN <=0.25 SENSITIVE Sensitive    PENICILLIN >=0.5 RESISTANT Resistant     RIFAMPIN <=0.5 SENSITIVE Sensitive     TRIMETH/SULFA <=10 SENSITIVE Sensitive     VANCOMYCIN 1 SENSITIVE Sensitive     TETRACYCLINE <=1 SENSITIVE Sensitive     MOXIFLOXACIN <=0.25 SENSITIVE Sensitive     * ABUNDANT STAPHYLOCOCCUS AUREUS  Urine culture     Status: None   Collection Time: 07/03/14  4:15 PM  Result Value Ref Range Status   Specimen Description URINE, CATHETERIZED  Final   Special Requests NONE  Final   Culture  Setup Time   Final    07/03/2014 23:13 Performed at [facility]    Colony Count   Final    >=100,000 COLONIES/ML Performed at [facility]    Culture   Final    ESCHERICHIA COLI Performed at [facility]    Report Status 07/06/2014 FINAL  Final   Organism ID, Bacteria ESCHERICHIA COLI  Final      Susceptibility   Escherichia coli - MIC*    AMPICILLIN >=32 RESISTANT Resistant     CEFAZOLIN <=4 SENSITIVE Sensitive     CEFTRIAXONE <=1 SENSITIVE Sensitive     CIPROFLOXACIN <=0.25 SENSITIVE Sensitive     GENTAMICIN <=1 SENSITIVE Sensitive     LEVOFLOXACIN 1 SENSITIVE Sensitive     NITROFURANTOIN <=16 SENSITIVE Sensitive     TOBRAMYCIN <=1 SENSITIVE Sensitive     TRIMETH/SULFA >=320 RESISTANT Resistant     PIP/TAZO <=4 SENSITIVE Sensitive     *  ESCHERICHIA COLI  Culture, blood (routine x 2)     Status: None   Collection Time: 07/03/14  8:30 PM  Result Value Ref Range Status   Specimen Description BLOOD RIGHT ARM  Final   Special Requests BOTTLES DRAWN AEROBIC AND ANAEROBIC 10CC  Final   Culture  Setup Time   Final    07/04/2014 01:42 Performed at [facility]    Culture   Final    NO GROWTH 5 DAYS Performed at [facility]    Report Status 07/10/2014 FINAL  Final  Culture, blood (routine x 2)     Status: None   Collection Time: 07/03/14  8:43 PM  Result Value Ref Range Status   Specimen Description BLOOD RIGHT FOREARM  Final   Special Requests BOTTLES DRAWN AEROBIC  ONLY 4CC  Final   Culture  Setup Time   Final    07/04/2014 01:43 Performed at [facility]    Culture   Final    STAPHYLOCOCCUS SPECIES (COAGULASE NEGATIVE) Note: THE SIGNIFICANCE OF ISOLATING THIS ORGANISM FROM A SINGLE SET OF BLOOD CULTURES WHEN MULTIPLE SETS ARE DRAWN IS UNCERTAIN. PLEASE NOTIFY THE MICROBIOLOGY DEPARTMENT WITHIN ONE WEEK IF SPECIATION AND SENSITIVITIES ARE REQUIRED. Note: Gram Stain Report Called to,Read Back By and Verified With: ELLIE MESSER 07/05/14 0845 BY SMITHERSJ Performed at [facility]    Report Status 07/06/2014 FINAL  Final  Clostridium Difficile by PCR     Status: None   Collection Time: 07/07/14  6:25 AM  Result Value Ref Range Status   C difficile by pcr NEGATIVE NEGATIVE Final  Culture, respiratory (NON-Expectorated)     Status: None   Collection Time: 07/15/14 12:10 AM  Result Value Ref Range Status   Specimen Description TRACHEAL ASPIRATE  Final   Special Requests NONE  Final   Gram Stain   Final    MODERATE WBC PRESENT, PREDOMINANTLY PMN NO SQUAMOUS EPITHELIAL CELLS SEEN NO ORGANISMS SEEN Performed at [facility]    Culture   Final    MODERATE SERRATIA MARCESCENS Performed at [facility]    Report Status 07/17/2014 FINAL  Final   Organism ID, Bacteria SERRATIA MARCESCENS  Final      Susceptibility   Serratia marcescens - MIC*    CEFAZOLIN >=64 RESISTANT Resistant     CEFEPIME <=1 SENSITIVE Sensitive     CEFTAZIDIME <=1 SENSITIVE Sensitive     CEFTRIAXONE <=1 SENSITIVE Sensitive     CIPROFLOXACIN <=0.25 SENSITIVE Sensitive     GENTAMICIN <=1 SENSITIVE Sensitive     TOBRAMYCIN 8 INTERMEDIATE Intermediate     TRIMETH/SULFA <=20 SENSITIVE Sensitive     * MODERATE SERRATIA MARCESCENS  Culture, Urine     Status: None   Collection Time: 07/15/14 12:17 AM  Result Value Ref Range Status   Specimen Description URINE, CATHETERIZED  Final   Special Requests NONE  Final   Culture  Setup Time   Final     07/15/2014 01:02 Performed at [facility]    Colony Count NO GROWTH Performed at [facility]   Final   Culture NO GROWTH Performed at [facility]   Final   Report Status 07/15/2014 FINAL  Final  Culture, blood (single)     Status: None (Preliminary result)   Collection Time: 07/15/14  1:01 AM  Result Value Ref Range Status   Specimen Description BLOOD LEFT HAND  Final   Special Requests   Final    BOTTLES  DRAWN AEROBIC AND ANAEROBIC 10CC BLUE 3CC PURPLE   Culture  Setup Time   Final    07/15/2014 10:09 Performed at [facility]    Culture   Final    GRAM POSITIVE COCCI IN PAIRS Performed at [facility]    Report Status PENDING  Incomplete  Culture, blood (single)     Status: None (Preliminary result)   Collection Time: 07/15/14  1:20 AM  Result Value Ref Range Status   Specimen Description BLOOD LEFT HAND  Final   Special Requests BOTTLES DRAWN AEROBIC ONLY 10CC  Final   Culture  Setup Time   Final    07/15/2014 10:09 Performed at [facility]    Culture   Final    GRAM POSITIVE COCCI IN PAIRS Note: Gram Stain Report Called to,Read Back By and Verified With: GABE SANTANELLA ON 07/15/2014 AT 9:54P BY WILEJ Performed at [facility]    Report Status PENDING  Incomplete  Clostridium Difficile by PCR     Status: None   Collection Time: 07/15/14 10:13 AM  Result Value Ref Range Status   C difficile by pcr NEGATIVE NEGATIVE Final    Anti-infectives    Start     Dose/Rate Route Frequency Ordered Stop   07/15/14 0800  vancomycin (VANCOCIN) IVPB 1000 mg/200 mL premix     1,000 mg200 mL/hr over 60 Minutes Intravenous Every 8 hours 07/15/14 0014     07/15/14 0600  piperacillin-tazobactam (ZOSYN) IVPB 3.375 g     3.375 g12.5 mL/hr over 240 Minutes Intravenous Every 8 hours 07/15/14 0014     07/15/14 0015  vancomycin (VANCOCIN) IVPB 1000 mg/200 mL premix     1,000 mg200 mL/hr over 60 Minutes Intravenous  Once 07/15/14  0014 07/15/14 0230   07/15/14 0015  piperacillin-tazobactam (ZOSYN) IVPB 3.375 g     3.375 g100 mL/hr over 30 Minutes Intravenous  Once 07/15/14 0014 07/15/14 0159   07/07/14 1200  ceFAZolin (ANCEF) IVPB 1 g/50 mL premix  Status:  Discontinued     1 g100 mL/hr over 30 Minutes Intravenous Every 8 hours 07/07/14 1046 07/14/14 2352   07/04/14 1930  piperacillin-tazobactam (ZOSYN) IVPB 3.375 g  Status:  Discontinued     3.375 g12.5 mL/hr over 240 Minutes Intravenous Every 8 hours 07/04/14 1117 07/07/14 1044   07/04/14 1130  piperacillin-tazobactam (ZOSYN) IVPB 3.375 g     3.375 g100 mL/hr over 30 Minutes Intravenous  Once 07/04/14 1117 07/04/14 1300   07/04/14 1130  vancomycin (VANCOCIN) IVPB 1000 mg/200 mL premix  Status:  Discontinued     1,000 mg200 mL/hr over 60 Minutes Intravenous Every 12 hours 07/04/14 1117 07/07/14 1044      Assessment: 53 YOF who was previously on vancomycin and Zosyn for a couple days, both resumed on 12/7 for suspected PNA seen on CXR. Sputum growing serratia marcescens (R to cefazolin only). A vancomycin trough drawn this morning is low at 10.25mcg/mL. Renal function has been low but stable, est CrCl ~7498mL/min.  Goal of Therapy:  Vancomycin trough level 15-20 mcg/ml  Plan:  1. Increase vancomycin to 1500mg  IV q8h for estimated trough of 4015mcg/mL 2. Continue Zosyn 3.375g IV q8h EI 3. Follow for any other c/s, renal function, trough at new SS  Kahleah Crass D. Lezley Bedgood, PharmD, BCPS Clinical Pharmacist Pager: [phone] 07/17/2014 8:41 AM

## 2014-07-17 NOTE — Anesthesia Preprocedure Evaluation (Signed)
Anesthesia Evaluation  Patient identified by MRN, date of birth, ID band Patient unresponsive    Reviewed: Allergy & Precautions, H&P , NPO status , Patient's Chart, lab work & pertinent test results, Unable to perform ROS - Chart review only  Airway Mallampati: Intubated       Dental  (+) Teeth Intact, Dental Advisory Given,    Pulmonary Current Smoker,  Currently intubated breath sounds clear to auscultation        Cardiovascular + Peripheral Vascular Disease Rhythm:Regular Rate:Normal     Neuro/Psych Seizures -, Poorly Controlled,  CVA    GI/Hepatic   Endo/Other    Renal/GU      Musculoskeletal   Abdominal   Peds  Hematology   Anesthesia Other Findings   Reproductive/Obstetrics                             Anesthesia Physical Anesthesia Plan  ASA: IV  Anesthesia Plan: General   Post-op Pain Management:    Induction: Intravenous and Inhalational  Airway Management Planned: Oral ETT  Additional Equipment:   Intra-op Plan:   Post-operative Plan: Post-operative intubation/ventilation  Informed Consent: I have reviewed the patients History and Physical, chart, labs and discussed the procedure including the risks, benefits and alternatives for the proposed anesthesia with the patient or authorized representative who has indicated his/her understanding and acceptance.     Plan Discussed with: CRNA, Anesthesiologist and Surgeon  Anesthesia Plan Comments:         Anesthesia Quick Evaluation

## 2014-07-17 NOTE — Plan of Care (Signed)
Problem: Phase II Progression Outcomes Goal: Pain controlled Outcome: Progressing Goal: Address Code Status with family Outcome: Completed/Met Date Met:  07/17/14
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
## 2014-07-17 NOTE — Progress Notes (Signed)
I continue to follow pt's progress. Currently Ranchos II, if does not progress, will need SNF rehab ore vent SNF if not weanable. [phone]
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
## 2014-07-18 ENCOUNTER — Inpatient Hospital Stay (HOSPITAL_COMMUNITY): Payer: No Typology Code available for payment source

## 2014-07-18 ENCOUNTER — Encounter (HOSPITAL_COMMUNITY): Admission: EM | Disposition: A | Discharge status: Rehabilitation Facility | Payer: Self-pay | Source: Home / Self Care

## 2014-07-18 HISTORY — PX: PEG PLACEMENT: SHX5437

## 2014-07-18 HISTORY — PX: PERCUTANEOUS TRACHEOSTOMY: SHX5288

## 2014-07-18 LAB — CULTURE, BLOOD (SINGLE)

## 2014-07-18 LAB — GLUCOSE, CAPILLARY
GLUCOSE-CAPILLARY: 110 mg/dL — AB (ref 70–99)
Glucose-Capillary: 138 mg/dL — ABNORMAL HIGH (ref 70–99)
Glucose-Capillary: 118 mg/dL — ABNORMAL HIGH (ref 70–99)
Glucose-Capillary: 127 mg/dL — ABNORMAL HIGH (ref 70–99)
Glucose-Capillary: 134 mg/dL — ABNORMAL HIGH (ref 70–99)
Glucose-Capillary: 129 mg/dL — ABNORMAL HIGH (ref 70–99)

## 2014-07-18 SURGERY — INSERTION, PEG TUBE

## 2014-07-18 SURGERY — CREATION, TRACHEOSTOMY, PERCUTANEOUS
Anesthesia: General

## 2014-07-18 MED ORDER — MIDAZOLAM HCL 2 MG/2ML IJ SOLN
INTRAMUSCULAR | Status: AC
Start: 2014-07-18 — End: 2014-07-18
  Filled 2014-07-18: qty 4

## 2014-07-18 MED ORDER — AMANTADINE HCL 50 MG/5ML PO SYRP
100.0000 mg | ORAL_SOLUTION | Freq: Two times a day (BID) | ORAL | Status: DC
  Administered 2014-07-18 – 2014-07-29 (×22): 100 mg
  Filled 2014-07-18 (×24): qty 10

## 2014-07-18 MED ORDER — VECURONIUM BROMIDE 10 MG IV SOLR
10.0000 mg | Freq: Once | INTRAVENOUS | Status: AC
  Administered 2014-07-18: 10 mg via INTRAVENOUS
  Filled 2014-07-18 (×2): qty 10

## 2014-07-18 MED ORDER — FENTANYL CITRATE 0.05 MG/ML IJ SOLN
INTRAMUSCULAR | Status: AC
  Filled 2014-07-18: qty 2

## 2014-07-18 MED ORDER — PANTOPRAZOLE SODIUM 40 MG PO PACK
40.0000 mg | PACK | Freq: Every day | ORAL | Status: DC
  Administered 2014-07-19 – 2014-07-29 (×11): 40 mg
  Filled 2014-07-18 (×12): qty 20

## 2014-07-18 MED ORDER — FENTANYL CITRATE 0.05 MG/ML IJ SOLN
100.0000 ug | Freq: Once | INTRAMUSCULAR | Status: AC
  Administered 2014-07-18: 100 ug via INTRAVENOUS

## 2014-07-18 MED ORDER — VECURONIUM BROMIDE 10 MG IV SOLR
INTRAVENOUS | Status: AC
  Filled 2014-07-18: qty 10

## 2014-07-18 MED ORDER — MIDAZOLAM HCL 2 MG/2ML IJ SOLN
4.0000 mg | Freq: Once | INTRAMUSCULAR | Status: AC
  Administered 2014-07-18: 4 mg via INTRAVENOUS

## 2014-07-18 MED ORDER — VECURONIUM BROMIDE 10 MG IV SOLR
10.0000 mg | Freq: Once | INTRAVENOUS | Status: AC
  Administered 2014-07-18: 10 mg via INTRAVENOUS

## 2014-07-18 MED ORDER — FENTANYL CITRATE 0.05 MG/ML IJ SOLN
200.0000 ug | Freq: Once | INTRAMUSCULAR | Status: AC
  Administered 2014-07-18: 200 ug via INTRAVENOUS

## 2014-07-18 MED ORDER — SODIUM CHLORIDE 0.9 % IJ SOLN
INTRAMUSCULAR | Status: DC | PRN
  Administered 2014-07-18 (×2): 10 mL

## 2014-07-18 MED ORDER — FENTANYL CITRATE 0.05 MG/ML IJ SOLN
INTRAMUSCULAR | Status: AC
  Filled 2014-07-18: qty 4

## 2014-07-18 MED ORDER — LEVETIRACETAM 100 MG/ML PO SOLN
500.0000 mg | Freq: Two times a day (BID) | ORAL | Status: DC
  Administered 2014-07-18 – 2014-07-29 (×22): 500 mg
  Filled 2014-07-18 (×24): qty 5

## 2014-07-18 MED ORDER — LIDOCAINE-EPINEPHRINE 1.5 %-1:200,000 OPTIME - NO CHARGE
INTRAMUSCULAR | Status: DC | PRN
  Administered 2014-07-18: 2.5 mL via SUBCUTANEOUS

## 2014-07-18 SURGICAL SUPPLY — 30 items
DRAPE PROXIMA HALF (DRAPES) ×6 IMPLANT
DRAPE UTILITY XL STRL (DRAPES) ×3 IMPLANT
ELECT CAUTERY BLADE 6.4 (BLADE) ×3 IMPLANT
ELECT REM PT RETURN 9FT ADLT (ELECTROSURGICAL) ×3
ELECTRODE REM PT RTRN 9FT ADLT (ELECTROSURGICAL) ×1 IMPLANT
GAUZE SPONGE 4X4 16PLY XRAY LF (GAUZE/BANDAGES/DRESSINGS) ×3 IMPLANT
GLOVE BIO SURGEON STRL SZ 6.5 (GLOVE) IMPLANT
GLOVE BIO SURGEON STRL SZ7.5 (GLOVE) ×3 IMPLANT
GLOVE BIO SURGEON STRL SZ8 (GLOVE) ×6 IMPLANT
GLOVE BIO SURGEONS STRL SZ 6.5 (GLOVE)
GLOVE BIOGEL PI IND STRL 7.5 (GLOVE) ×1 IMPLANT
GLOVE BIOGEL PI IND STRL 8 (GLOVE) ×2 IMPLANT
GLOVE BIOGEL PI INDICATOR 7.5 (GLOVE) ×2
GLOVE BIOGEL PI INDICATOR 8 (GLOVE) ×4
GOWN STRL REUS W/ TWL LRG LVL3 (GOWN DISPOSABLE) ×1 IMPLANT
GOWN STRL REUS W/ TWL XL LVL3 (GOWN DISPOSABLE) ×2 IMPLANT
GOWN STRL REUS W/TWL LRG LVL3 (GOWN DISPOSABLE) ×2
GOWN STRL REUS W/TWL XL LVL3 (GOWN DISPOSABLE) ×4
INTRODUCER TRACH BLUE RHINO 6F (TUBING) ×3 IMPLANT
INTRODUCER TRACH BLUE RHINO 8F (TUBING) IMPLANT
PENCIL BUTTON HOLSTER BLD 10FT (ELECTRODE) ×3 IMPLANT
SPONGE DRAIN TRACH 4X4 STRL 2S (GAUZE/BANDAGES/DRESSINGS) ×3 IMPLANT
SPONGE INTESTINAL PEANUT (DISPOSABLE) ×3 IMPLANT
SUT SILK 3 0 SH CR/8 (SUTURE) IMPLANT
SUT VICRYL AB 3 0 TIES (SUTURE) IMPLANT
TOWEL OR 17X24 6PK STRL BLUE (TOWEL DISPOSABLE) IMPLANT
TOWEL OR 17X26 10 PK STRL BLUE (TOWEL DISPOSABLE) ×3 IMPLANT
TUBE CONNECTING 12'X1/4 (SUCTIONS) ×1
TUBE CONNECTING 12X1/4 (SUCTIONS) ×2 IMPLANT
YANKAUER SUCT BULB TIP NO VENT (SUCTIONS) ×3 IMPLANT

## 2014-07-18 SURGICAL SUPPLY — 1 items: PEG ×3 IMPLANT

## 2014-07-18 NOTE — Plan of Care (Signed)
Problem: Consults Goal: Diabetes Guidelines if Diabetic/Glucose > 140 If diabetic or lab glucose is > 140 mg/dl - Initiate Diabetes/Hyperglycemia Guidelines & Document Interventions  Outcome: Completed/Met Date Met:  07/18/14  Problem: Phase I Progression Outcomes Goal: No signs of CSF leak Outcome: Progressing  Problem: Phase II Progression Outcomes Goal: Tolerating diet Outcome: Progressing
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
## 2014-07-18 NOTE — Progress Notes (Signed)
SLP Cancellation Note  Patient Details Name: [name] LauberRhonda [name] MRN: [id number] DOB: 08/02/1961   Cancelled treatment:       Reason Eval/Treat Not Completed: Medical issues which prohibited therapy. For trach today. Will f/u 12/11   [name] 07/18/2014, 11:43 AM

## 2014-07-18 NOTE — Plan of Care (Signed)
Problem: Phase I Progression Outcomes Goal: Hemodynamically stable Outcome: Completed/Met Date Met:  07/18/14  Problem: Phase II Progression Outcomes Goal: Pain controlled Outcome: Completed/Met Date Met:  07/18/14
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
## 2014-07-18 NOTE — Clinical Social Work Note (Signed)
Clinical Social Worker continuing to follow patient and family for support and discharge planning needs.  No family currently at bedside.  Patient with trach/PEG placement today.  Per MD, patient to wean to trach collar and hopeful for CIR, pending continued improvements with therapies.  CSW remains available for support and to initiate SNF search if needed once medically appropriate.  Macario GoldsJesse Cayleb Jarnigan, [place] [phone]

## 2014-07-18 NOTE — Plan of Care (Signed)
Problem: Acute Rehab OT Goals (only OT should resolve) Goal: Pt. Will Perform Eating Outcome: Not Applicable Date Met:  22/57/50

## 2014-07-19 ENCOUNTER — Encounter (HOSPITAL_COMMUNITY): Payer: Self-pay | Admitting: General Surgery

## 2014-07-19 LAB — GLUCOSE, CAPILLARY
GLUCOSE-CAPILLARY: 122 mg/dL — AB (ref 70–99)
GLUCOSE-CAPILLARY: 142 mg/dL — AB (ref 70–99)
Glucose-Capillary: 119 mg/dL — ABNORMAL HIGH (ref 70–99)
Glucose-Capillary: 121 mg/dL — ABNORMAL HIGH (ref 70–99)
Glucose-Capillary: 124 mg/dL — ABNORMAL HIGH (ref 70–99)
Glucose-Capillary: 149 mg/dL — ABNORMAL HIGH (ref 70–99)

## 2014-07-19 NOTE — Plan of Care (Signed)
Problem: Phase II Progression Outcomes Goal: ICP not requiring interventions Outcome: Completed/Met Date Met:  07/19/14  Problem: Phase III Progression Outcomes Goal: Wean off ventilation Outcome: Progressing

## 2014-07-19 NOTE — Clinical Social Work Note (Signed)
Clinical Social Worker continuing to follow patient and family for support and discharge planning needs.  CSW spoke with inpatient rehab admissions coordinator who states that patient is not appropriate for inpatient rehab and will need SNF placement.  Patient has a trach with plans to wean towards trach collar this afternoon.  Per MD, patient to wean to trach collar rather quickly.  CSW awaits patient maintaining trach collar at 28% to initiate SNF search.  No family currently at bedside.  CSW to follow up with patient daughter.  CSW remains available for support and to facilitate patient discharge needs once medically ready.  Macario GoldsJesse Merdith Boyd, [place] [phone]
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
## 2014-07-19 NOTE — Progress Notes (Signed)
Tried trach collor, RR increased to over 40
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
## 2014-07-19 NOTE — Progress Notes (Signed)
Pt on CP/PS 8/5, 40% throughout day, tolerated well w/ RR 23-34.   Attempted trial of 40% trach collar - pt immediately became tachypneic to RR 40+, labored.  Returned to previous CP/PS settings.

## 2014-07-19 NOTE — Progress Notes (Signed)
UR completed.  Acute therapies recommending CIR at d/c.   Carlyle LipaMichelle Maciah Feeback, RN BSN MHA CCM Trauma/Neuro ICU Case Manager [phone]

## 2014-07-19 NOTE — Progress Notes (Signed)
Discussed with P.T. And with SW. [facility] Pt will need SNF level rehab at this time. If she begins to progress, please reconsult us. Currently vented and will need an extended recovery before returning home with limited assistance. I will notify RN CM. [phone]

## 2014-07-20 LAB — GLUCOSE, CAPILLARY
GLUCOSE-CAPILLARY: 124 mg/dL — AB (ref 70–99)
Glucose-Capillary: 117 mg/dL — ABNORMAL HIGH (ref 70–99)
Glucose-Capillary: 128 mg/dL — ABNORMAL HIGH (ref 70–99)
Glucose-Capillary: 128 mg/dL — ABNORMAL HIGH (ref 70–99)
Glucose-Capillary: 135 mg/dL — ABNORMAL HIGH (ref 70–99)
Glucose-Capillary: 140 mg/dL — ABNORMAL HIGH (ref 70–99)
Glucose-Capillary: 151 mg/dL — ABNORMAL HIGH (ref 70–99)

## 2014-07-20 LAB — CBC
HCT: 29.4 % — ABNORMAL LOW (ref 36.0–46.0)
Hemoglobin: 9.8 g/dL — ABNORMAL LOW (ref 12.0–15.0)
MCH: 31.9 pg (ref 26.0–34.0)
MCHC: 33.3 g/dL (ref 30.0–36.0)
MCV: 95.8 fL (ref 78.0–100.0)
PLATELETS: 281 10*3/uL (ref 150–400)
RBC: 3.07 MIL/uL — AB (ref 3.87–5.11)
RDW: 15.4 % (ref 11.5–15.5)
WBC: 13.3 10*3/uL — ABNORMAL HIGH (ref 4.0–10.5)

## 2014-07-20 LAB — BASIC METABOLIC PANEL
ANION GAP: 11 (ref 5–15)
BUN: 13 mg/dL (ref 6–23)
CO2: 26 meq/L (ref 19–32)
CREATININE: 0.32 mg/dL — AB (ref 0.50–1.10)
Calcium: 8.1 mg/dL — ABNORMAL LOW (ref 8.4–10.5)
Chloride: 99 mEq/L (ref 96–112)
GFR calc Af Amer: >90 mL/min (ref >90)
Glucose, Bld: 141 mg/dL — ABNORMAL HIGH (ref 70–99)
Potassium: 3.5 mEq/L — ABNORMAL LOW (ref 3.7–5.3)
SODIUM: 136 meq/L — AB (ref 137–147)
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
## 2014-07-20 NOTE — Progress Notes (Signed)
Patient ID: [name], female   DOB: 12/31/1960, 53 y.o.   MRN: [id number] Patient is minimally responsive to pain Not following any commands Dressings are clean and dry No significant changes

## 2014-07-20 NOTE — Plan of Care (Signed)
Problem: Phase I Progression Outcomes Goal: No signs of CSF leak Outcome: Progressing No signs of CSF leak during this shift.   Problem: Phase III Progression Outcomes Goal: Wean off ventilation Outcome: Progressing Pt weaned all day 12/11 but wasn't able to tolerate trach collar alone.  Goal: Nutrition/Tube feeding at goal Outcome: Completed/Met Date Met:  07/20/14 Tube feeding rate is currently at goal rate.

## 2014-07-20 NOTE — Progress Notes (Signed)
Chaplain visited with pt and family around the room (pre-teen or younger grandchildren, sister, and another). Nurse paged that family requested prayer. Family expressed that it has been quite difficult and weighty. When asked what to pray for, they asked "for her to get better and for us". Pt's sister also requested that I come by and continue to pray for the pt. Informed the family that Chaplain services were available.   Page if needed.   Gala RomneyBrown, Charlyne Robertshaw J, Chaplain 07/20/2014

## 2014-07-20 NOTE — Progress Notes (Signed)
Follow up - Trauma and Critical Care  Patient Details:    [name] LauberRhonda [name] is an 53 y.o. female.  Lines/tubes : PICC / Midline Double Lumen 07/16/14 PICC Left Basilic 48 cm 0 cm (Active)  Indication for Insertion or Continuance of Line Prolonged intravenous therapies 07/20/2014  8:00 AM  Exposed Catheter (cm) 0 cm 07/16/2014  6:23 PM  Site Assessment Clean;Dry;Intact 07/20/2014  8:00 AM  Lumen #1 Status Infusing 07/20/2014  8:00 AM  Lumen #2 Status Flushed;Saline locked 07/20/2014  8:00 AM  Dressing Type Transparent 07/20/2014  8:00 AM  Dressing Status Clean;Dry;Intact;Antimicrobial disc in place 07/20/2014  8:00 AM  Line Care Connections checked and tightened 07/20/2014  8:00 AM  Dressing Change Due 07/23/14 07/20/2014  8:00 AM     Gastrostomy/Enterostomy PEG-jejunostomy LUQ (Active)  Surrounding Skin Dry;Intact 07/20/2014  8:00 AM  Tube Status Patent 07/20/2014  8:00 AM  Drainage Appearance Tan 07/19/2014  8:00 PM  Dressing Status Clean;Dry;Intact 07/20/2014  8:00 AM  Dressing Intervention Dressing reinforced 07/19/2014  8:00 PM  Dressing Type Split gauze 07/20/2014  8:00 AM  Gastric Residual 10 mL 07/20/2014  8:00 AM  G Port Intake (mL) 30 ml 07/20/2014  4:00 AM     Urethral Catheter Beryle QuantMatt York, RN Latex 14 Fr. (Active)  Indication for Insertion or Continuance of Catheter Other (comment) 07/20/2014  8:00 AM  Site Assessment Intact;Clean 07/20/2014  8:00 AM  Catheter Maintenance Bag below level of bladder;Catheter secured;Drainage bag/tubing not touching floor;No dependent loops;Seal intact;Bag emptied prior to transport 07/20/2014  8:00 AM  Collection Container Standard drainage bag 07/20/2014  8:00 AM  Securement Method Leg strap 07/20/2014  8:00 AM  Urinary Catheter Interventions Unclamped 07/20/2014  8:00 AM  Output (mL) 270 mL 07/20/2014 11:00 AM    Microbiology/Sepsis markers: Results for orders placed or performed during the hospital encounter of 06/29/14  Culture,  respiratory (NON-Expectorated)     Status: None   Collection Time: 07/03/14  3:57 PM  Result Value Ref Range Status   Specimen Description TRACHEAL ASPIRATE  Final   Special Requests NONE  Final   Gram Stain   Final    MODERATE WBC PRESENT, PREDOMINANTLY PMN RARE SQUAMOUS EPITHELIAL CELLS PRESENT MODERATE GRAM POSITIVE COCCI IN PAIRS FEW GRAM NEGATIVE RODS Performed at [facility]    Culture   Final    ABUNDANT STAPHYLOCOCCUS AUREUS Note: RIFAMPIN AND GENTAMICIN SHOULD NOT BE USED AS SINGLE DRUGS FOR TREATMENT OF STAPH INFECTIONS. Performed at [facility]    Report Status 07/06/2014 FINAL  Final   Organism ID, Bacteria STAPHYLOCOCCUS AUREUS  Final      Susceptibility   Staphylococcus aureus - MIC*    CLINDAMYCIN <=0.25 SENSITIVE Sensitive     ERYTHROMYCIN <=0.25 SENSITIVE Sensitive     GENTAMICIN <=0.5 SENSITIVE Sensitive     LEVOFLOXACIN <=0.12 SENSITIVE Sensitive     OXACILLIN <=0.25 SENSITIVE Sensitive     PENICILLIN >=0.5 RESISTANT Resistant     RIFAMPIN <=0.5 SENSITIVE Sensitive     TRIMETH/SULFA <=10 SENSITIVE Sensitive     VANCOMYCIN 1 SENSITIVE Sensitive     TETRACYCLINE <=1 SENSITIVE Sensitive     MOXIFLOXACIN <=0.25 SENSITIVE Sensitive     * ABUNDANT STAPHYLOCOCCUS AUREUS  Urine culture     Status: None   Collection Time: 07/03/14  4:15 PM  Result Value Ref Range Status   Specimen Description URINE, CATHETERIZED  Final   Special Requests NONE  Final   Culture  Setup Time   Final  07/03/2014 23:13 Performed at [facility] Count   Final    >=100,000 COLONIES/ML Performed at [facility]    Culture   Final    ESCHERICHIA COLI Performed at [facility]    Report Status 07/06/2014 FINAL  Final   Organism ID, Bacteria ESCHERICHIA COLI  Final      Susceptibility   Escherichia coli - MIC*    AMPICILLIN >=32 RESISTANT Resistant     CEFAZOLIN <=4 SENSITIVE Sensitive     CEFTRIAXONE <=1 SENSITIVE Sensitive      CIPROFLOXACIN <=0.25 SENSITIVE Sensitive     GENTAMICIN <=1 SENSITIVE Sensitive     LEVOFLOXACIN 1 SENSITIVE Sensitive     NITROFURANTOIN <=16 SENSITIVE Sensitive     TOBRAMYCIN <=1 SENSITIVE Sensitive     TRIMETH/SULFA >=320 RESISTANT Resistant     PIP/TAZO <=4 SENSITIVE Sensitive     * ESCHERICHIA COLI  Culture, blood (routine x 2)     Status: None   Collection Time: 07/03/14  8:30 PM  Result Value Ref Range Status   Specimen Description BLOOD RIGHT ARM  Final   Special Requests BOTTLES DRAWN AEROBIC AND ANAEROBIC 10CC  Final   Culture  Setup Time   Final    07/04/2014 01:42 Performed at [facility]    Culture   Final    NO GROWTH 5 DAYS Performed at [facility]    Report Status 07/10/2014 FINAL  Final  Culture, blood (routine x 2)     Status: None   Collection Time: 07/03/14  8:43 PM  Result Value Ref Range Status   Specimen Description BLOOD RIGHT FOREARM  Final   Special Requests BOTTLES DRAWN AEROBIC ONLY 4CC  Final   Culture  Setup Time   Final    07/04/2014 01:43 Performed at [facility]    Culture   Final    STAPHYLOCOCCUS SPECIES (COAGULASE NEGATIVE) Note: THE SIGNIFICANCE OF ISOLATING THIS ORGANISM FROM A SINGLE SET OF BLOOD CULTURES WHEN MULTIPLE SETS ARE DRAWN IS UNCERTAIN. PLEASE NOTIFY THE MICROBIOLOGY DEPARTMENT WITHIN ONE WEEK IF SPECIATION AND SENSITIVITIES ARE REQUIRED. Note: Gram Stain Report Called to,Read Back By and Verified With: ELLIE MESSER 07/05/14 0845 BY SMITHERSJ Performed at [facility]    Report Status 07/06/2014 FINAL  Final  Clostridium Difficile by PCR     Status: None   Collection Time: 07/07/14  6:25 AM  Result Value Ref Range Status   C difficile by pcr NEGATIVE NEGATIVE Final  Culture, respiratory (NON-Expectorated)     Status: None   Collection Time: 07/15/14 12:10 AM  Result Value Ref Range Status   Specimen Description TRACHEAL ASPIRATE  Final   Special Requests NONE  Final   Gram Stain    Final    MODERATE WBC PRESENT, PREDOMINANTLY PMN NO SQUAMOUS EPITHELIAL CELLS SEEN NO ORGANISMS SEEN Performed at [facility]    Culture   Final    MODERATE SERRATIA MARCESCENS Performed at [facility]    Report Status 07/17/2014 FINAL  Final   Organism ID, Bacteria SERRATIA MARCESCENS  Final      Susceptibility   Serratia marcescens - MIC*    CEFAZOLIN >=64 RESISTANT Resistant     CEFEPIME <=1 SENSITIVE Sensitive     CEFTAZIDIME <=1 SENSITIVE Sensitive     CEFTRIAXONE <=1 SENSITIVE Sensitive     CIPROFLOXACIN <=0.25 SENSITIVE Sensitive     GENTAMICIN <=1 SENSITIVE Sensitive     TOBRAMYCIN 8  INTERMEDIATE Intermediate     TRIMETH/SULFA <=20 SENSITIVE Sensitive     * MODERATE SERRATIA MARCESCENS  Culture, Urine     Status: None   Collection Time: 07/15/14 12:17 AM  Result Value Ref Range Status   Specimen Description URINE, CATHETERIZED  Final   Special Requests NONE  Final   Culture  Setup Time   Final    07/15/2014 01:02 Performed at [facility]    Colony Count NO GROWTH Performed at [facility]   Final   Culture NO GROWTH Performed at [facility]   Final   Report Status 07/15/2014 FINAL  Final  Culture, blood (single)     Status: None   Collection Time: 07/15/14  1:01 AM  Result Value Ref Range Status   Specimen Description BLOOD LEFT HAND  Final   Special Requests   Final    BOTTLES DRAWN AEROBIC AND ANAEROBIC 10CC BLUE 3CC RED   Culture  Setup Time   Final    07/15/2014 10:09 Performed at [facility]    Culture   Final    STREPTOCOCCUS PNEUMONIAE Note: Gram Stain Report Called to,Read Back By and Verified With: Sherolyn BubaGABE SANTANELLA ON 07/15/2014 AT 9:54P BY WILEJ Performed at [facility]    Report Status 07/18/2014 FINAL  Final   Organism ID, Bacteria STREPTOCOCCUS PNEUMONIAE  Final      Susceptibility   Streptococcus pneumoniae - MIC (ETEST)*    CEFTRIAXONE 0.50 SENSITIVE Sensitive     LEVOFLOXACIN  1.0 SENSITIVE Sensitive     PENICILLIN 1.0 INTERMEDIATE Intermediate     * STREPTOCOCCUS PNEUMONIAE  Culture, blood (single)     Status: None   Collection Time: 07/15/14  1:20 AM  Result Value Ref Range Status   Specimen Description BLOOD LEFT HAND  Final   Special Requests BOTTLES DRAWN AEROBIC ONLY 10CC  Final   Culture  Setup Time   Final    07/15/2014 10:09 Performed at [facility]    Culture   Final    STREPTOCOCCUS PNEUMONIAE Note: SUSCEPTIBILITIES PERFORMED ON PREVIOUS CULTURE WITHIN THE LAST 5 DAYS. Note: Gram Stain Report Called to,Read Back By and Verified With: GABE SANTANELLA ON 07/15/2014 AT 9:54P BY Serafina MitchellWILEJ Performed at [facility]    Report Status 07/18/2014 FINAL  Final  Clostridium Difficile by PCR     Status: None   Collection Time: 07/15/14 10:13 AM  Result Value Ref Range Status   C difficile by pcr NEGATIVE NEGATIVE Final    Anti-infectives:  Anti-infectives    Start     Dose/Rate Route Frequency Ordered Stop   07/17/14 1952  bacitracin 50,000 Units in sodium chloride irrigation 0.9 % 500 mL irrigation  Status:  Discontinued       As needed 07/17/14 2024 07/17/14 2046   07/17/14 1600  vancomycin (VANCOCIN) 1,500 mg in sodium chloride 0.9 % 500 mL IVPB  Status:  Discontinued     1,500 mg250 mL/hr over 120 Minutes Intravenous Every 8 hours 07/17/14 0842 07/17/14 1319   07/17/14 1400  cefTRIAXone (ROCEPHIN) 2 g in dextrose 5 % 50 mL IVPB - Premix     2 g100 mL/hr over 30 Minutes Intravenous Every 24 hours 07/17/14 1319     07/15/14 0800  vancomycin (VANCOCIN) IVPB 1000 mg/200 mL premix  Status:  Discontinued     1,000 mg200 mL/hr over 60 Minutes Intravenous Every 8 hours 07/15/14 0014 07/17/14 0842   07/15/14 0600  piperacillin-tazobactam (ZOSYN) IVPB  3.375 g  Status:  Discontinued     3.375 g12.5 mL/hr over 240 Minutes Intravenous Every 8 hours 07/15/14 0014 07/17/14 1319   07/15/14 0015  vancomycin (VANCOCIN) IVPB 1000 mg/200 mL premix      1,000 mg200 mL/hr over 60 Minutes Intravenous  Once 07/15/14 0014 07/15/14 0230   07/15/14 0015  piperacillin-tazobactam (ZOSYN) IVPB 3.375 g     3.375 g100 mL/hr over 30 Minutes Intravenous  Once 07/15/14 0014 07/15/14 0159   07/07/14 1200  ceFAZolin (ANCEF) IVPB 1 g/50 mL premix  Status:  Discontinued     1 g100 mL/hr over 30 Minutes Intravenous Every 8 hours 07/07/14 1046 07/14/14 2352   07/04/14 1930  piperacillin-tazobactam (ZOSYN) IVPB 3.375 g  Status:  Discontinued     3.375 g12.5 mL/hr over 240 Minutes Intravenous Every 8 hours 07/04/14 1117 07/07/14 1044   07/04/14 1130  piperacillin-tazobactam (ZOSYN) IVPB 3.375 g     3.375 g100 mL/hr over 30 Minutes Intravenous  Once 07/04/14 1117 07/04/14 1300   07/04/14 1130  vancomycin (VANCOCIN) IVPB 1000 mg/200 mL premix  Status:  Discontinued     1,000 mg200 mL/hr over 60 Minutes Intravenous Every 12 hours 07/04/14 1117 07/07/14 1044      Best Practice/Protocols:  VTE Prophylaxis: Mechanical GI Prophylaxis: Proton Pump Inhibitor No sedation and no pressors.    Consults: Treatment Team:  Tressie Stalker, MD    Events:  Subjective:    Overnight Issues: Not doing very much at all.  Poorly responsive.  Objective:  Vital signs for last 24 hours: Temp:  [98.8 F (37.1 C)-100.4 F (38 C)] 98.8 F (37.1 C) (12/12 0400) Pulse Rate:  [70-83] 83 (12/12 1117) Resp:  [18-35] 30 (12/12 1117) BP: (127-143)/(70-97) 127/71 mmHg (12/12 1100) SpO2:  [95 %-100 %] 98 % (12/12 1117) FiO2 (%):  [40 %] 40 % (12/12 1117) Weight:  [90.3 kg (199 lb 1.2 oz)] 90.3 kg (199 lb 1.2 oz) (12/12 0600)  Hemodynamic parameters for last 24 hours:    Intake/Output from previous day: 12/11 0701 - 12/12 0700 In: 1805 [I.V.:1295; NG/GT:350; IV Piggyback:50] Out: 1985 [Urine:1685; Stool:300]  Intake/Output this shift: Total I/O In: 280 [I.V.:200; NG/GT:80] Out: 420 [Urine:420]  Vent settings for last 24 hours: Vent Mode:  [-] CPAP;PSV FiO2 (%):  [40  %] 40 % Set Rate:  [16 bmp] 16 bmp Vt Set:  [460 mL] 460 mL PEEP:  [5 cmH20] 5 cmH20 Pressure Support:  [5 cmH20-8 cmH20] 5 cmH20 Plateau Pressure:  [15 cmH20-17 cmH20] 15 cmH20  Physical Exam:  General: no respiratory distress Neuro: RASS 0, weakness right upper extremity, weakness right lower extremity and not very responsive at all Resp: clear to auscultation bilaterally and Vt only 350 on pressure support of 5 GI: soft, nontender, BS WNL, no r/g and Tolerating tube feedngs at goal rate of 20cc/hr Extremities: edema 1+  Results for orders placed or performed during the hospital encounter of 06/29/14 (from the past 24 hour(s))  Glucose, capillary     Status: Abnormal   Collection Time: 07/19/14 12:08 PM  Result Value Ref Range   Glucose-Capillary 121 (H) 70 - 99 mg/dL   Comment 1 Notify RN   Glucose, capillary     Status: Abnormal   Collection Time: 07/19/14  3:54 PM  Result Value Ref Range   Glucose-Capillary 149 (H) 70 - 99 mg/dL  Glucose, capillary     Status: Abnormal   Collection Time: 07/19/14  8:45 PM  Result Value Ref Range  Glucose-Capillary 124 (H) 70 - 99 mg/dL   Comment 1 Notify RN    Comment 2 Documented in Chart   Glucose, capillary     Status: Abnormal   Collection Time: 07/19/14 11:38 PM  Result Value Ref Range   Glucose-Capillary 142 (H) 70 - 99 mg/dL   Comment 1 Notify RN    Comment 2 Documented in Chart   Glucose, capillary     Status: Abnormal   Collection Time: 07/20/14  4:13 AM  Result Value Ref Range   Glucose-Capillary 117 (H) 70 - 99 mg/dL   Comment 1 Documented in Chart    Comment 2 Notify RN   CBC     Status: Abnormal   Collection Time: 07/20/14  5:50 AM  Result Value Ref Range   WBC 13.3 (H) 4.0 - 10.5 K/uL   RBC 3.07 (L) 3.87 - 5.11 MIL/uL   Hemoglobin 9.8 (L) 12.0 - 15.0 g/dL   HCT 16.1 (L) 09.6 - 04.5 %   MCV 95.8 78.0 - 100.0 fL   MCH 31.9 26.0 - 34.0 pg   MCHC 33.3 30.0 - 36.0 g/dL   RDW 40.9 81.1 - 91.4 %   Platelets 281 150 -  400 K/uL  Basic metabolic panel     Status: Abnormal   Collection Time: 07/20/14  5:50 AM  Result Value Ref Range   Sodium 136 (L) 137 - 147 mEq/L   Potassium 3.5 (L) 3.7 - 5.3 mEq/L   Chloride 99 96 - 112 mEq/L   CO2 26 19 - 32 mEq/L   Glucose, Bld 141 (H) 70 - 99 mg/dL   BUN 13 6 - 23 mg/dL   Creatinine, Ser 7.82 (L) 0.50 - 1.10 mg/dL   Calcium 8.1 (L) 8.4 - 10.5 mg/dL   GFR calc non Af Amer >90 >90 mL/min   GFR calc Af Amer >90 >90 mL/min   Anion gap 11 5 - 15  Glucose, capillary     Status: Abnormal   Collection Time: 07/20/14  8:29 AM  Result Value Ref Range   Glucose-Capillary 151 (H) 70 - 99 mg/dL     Assessment/Plan:   NEURO  Altered Mental Status:  coma   Plan: Continue to hold sedation  PULM  No changes   Plan: Wean as tolerated  CARDIO  No issues   Plan: CPM  RENAL  Urine output   Plan: CPM  GI  Tolerating tube feedings.   Plan: CPM  ID  No active infection being treated.   Plan: CPM  HEME  Anemia anemia of critical illness)   Plan: No blood  ENDO No known problems   Plan: CPM  Global Issues  Hopefully the patient will start to awaken.  Holding all sedation.  Not weaning easily on the ventilator.  CT head two days ago unremarkable.    LOS: 21 days   Additional comments:I reviewed the patient's new clinical lab test results. cbc/bmet  Critical Care Total Time*: 30 Minutes  Shavonne Ambroise, JAY 07/20/2014  *Care during the described time interval was provided by me and/or other providers on the critical care team.  I have reviewed this patient's available data, including medical history, events of note, physical examination and test results as part of my evaluation.

## 2014-07-21 LAB — GLUCOSE, CAPILLARY
GLUCOSE-CAPILLARY: 127 mg/dL — AB (ref 70–99)
Glucose-Capillary: 135 mg/dL — ABNORMAL HIGH (ref 70–99)
Glucose-Capillary: 154 mg/dL — ABNORMAL HIGH (ref 70–99)
Glucose-Capillary: 135 mg/dL — ABNORMAL HIGH (ref 70–99)

## 2014-07-21 NOTE — Progress Notes (Signed)
Patient ID: [name] LauberRhonda Jipson, female   DOB: 10/13/1960, 53 y.o.   MRN: [id number] Vital signs are stable. Still significantly comatose however patient is starting to breathe on trach collar. CT scan from 1210 demonstrates that the ventricles are well decompressed. Neurologically stable with some slight improvements. Okay to transfer from ICU to stepdown per neurosurgery.

## 2014-07-21 NOTE — Progress Notes (Signed)
3 Days Post-Op  Subjective: Pt doing well on TC Tol TFs  Objective: Vital signs in last 24 hours: Temp:  [97.5 F (36.4 C)-99.9 F (37.7 C)] 99.6 F (37.6 C) (12/13 0800) Pulse Rate:  [67-83] 71 (12/13 1131) Resp:  [21-31] 22 (12/13 1131) BP: (122-151)/(65-85) 146/80 mmHg (12/13 1000) SpO2:  [95 %-100 %] 97 % (12/13 1131) FiO2 (%):  [40 %] 40 % (12/13 1131) Weight:  [200 lb 6.4 oz (90.9 kg)] 200 lb 6.4 oz (90.9 kg) (12/13 0355) Last BM Date: 07/20/14  Intake/Output from previous day: 12/12 0701 - 12/13 0700 In: 1840 [I.V.:1210; NG/GT:480] Out: 2835 [Urine:2835] Intake/Output this shift: Total I/O In: 210 [I.V.:150; NG/GT:60] Out: 175 [Urine:175]  General appearance: alert and cooperative GI: soft, non-tender; bowel sounds normal; no masses,  no organomegaly  Lab Results:   Recent Labs  07/20/14 0550  WBC 13.3*  HGB 9.8*  HCT 29.4*  PLT 281   BMET  Recent Labs  07/20/14 0550  NA 136*  K 3.5*  CL 99  CO2 26  GLUCOSE 141*  BUN 13  CREATININE 0.32*  CALCIUM 8.1*   PT/INR No results for input(s): LABPROT, INR in the last 72 hours. ABG No results for input(s): PHART, HCO3 in the last 72 hours.  Invalid input(s): PCO2, PO2  Studies/Results: No results found.  Anti-infectives: Anti-infectives    Start     Dose/Rate Route Frequency Ordered Stop   07/17/14 1952  bacitracin 50,000 Units in sodium chloride irrigation 0.9 % 500 mL irrigation  Status:  Discontinued       As needed 07/17/14 2024 07/17/14 2046   07/17/14 1600  vancomycin (VANCOCIN) 1,500 mg in sodium chloride 0.9 % 500 mL IVPB  Status:  Discontinued     1,500 mg250 mL/hr over 120 Minutes Intravenous Every 8 hours 07/17/14 0842 07/17/14 1319   07/17/14 1400  cefTRIAXone (ROCEPHIN) 2 g in dextrose 5 % 50 mL IVPB - Premix     2 g100 mL/hr over 30 Minutes Intravenous Every 24 hours 07/17/14 1319     07/15/14 0800  vancomycin (VANCOCIN) IVPB 1000 mg/200 mL premix  Status:  Discontinued     1,000  mg200 mL/hr over 60 Minutes Intravenous Every 8 hours 07/15/14 0014 07/17/14 0842   07/15/14 0600  piperacillin-tazobactam (ZOSYN) IVPB 3.375 g  Status:  Discontinued     3.375 g12.5 mL/hr over 240 Minutes Intravenous Every 8 hours 07/15/14 0014 07/17/14 1319   07/15/14 0015  vancomycin (VANCOCIN) IVPB 1000 mg/200 mL premix     1,000 mg200 mL/hr over 60 Minutes Intravenous  Once 07/15/14 0014 07/15/14 0230   07/15/14 0015  piperacillin-tazobactam (ZOSYN) IVPB 3.375 g     3.375 g100 mL/hr over 30 Minutes Intravenous  Once 07/15/14 0014 07/15/14 0159   07/07/14 1200  ceFAZolin (ANCEF) IVPB 1 g/50 mL premix  Status:  Discontinued     1 g100 mL/hr over 30 Minutes Intravenous Every 8 hours 07/07/14 1046 07/14/14 2352   07/04/14 1930  piperacillin-tazobactam (ZOSYN) IVPB 3.375 g  Status:  Discontinued     3.375 g12.5 mL/hr over 240 Minutes Intravenous Every 8 hours 07/04/14 1117 07/07/14 1044   07/04/14 1130  piperacillin-tazobactam (ZOSYN) IVPB 3.375 g     3.375 g100 mL/hr over 30 Minutes Intravenous  Once 07/04/14 1117 07/04/14 1300   07/04/14 1130  vancomycin (VANCOCIN) IVPB 1000 mg/200 mL premix  Status:  Discontinued     1,000 mg200 mL/hr over 60 Minutes Intravenous Every 12 hours 07/04/14  1117 07/07/14 1044      Assessment/Plan: s/p Procedure(s): PERCUTANEOUS TRACHEOSTOMY (N/A) Transfer to SDU con't current care   LOS: 22 days    [name] Ehlersamirez Jr., [name] [name] 07/21/2014

## 2014-07-21 NOTE — Plan of Care (Signed)
Problem: Phase I Progression Outcomes Goal: OOB as tolerated unless otherwise ordered Outcome: Not Progressing Pt is unable to progress activity at this time.  Problem: Phase II Progression Outcomes Goal: Progress activity as tolerated unless otherwise ordered Outcome: Not Progressing Pt unable to progress activity at this time.     Problem: Phase III Progression Outcomes Goal: Wean off ventilation Outcome: Progressing Pt has been on trach collar for this entire shift.

## 2014-07-22 LAB — GLUCOSE, CAPILLARY
GLUCOSE-CAPILLARY: 154 mg/dL — AB (ref 70–99)
GLUCOSE-CAPILLARY: 126 mg/dL — AB (ref 70–99)
Glucose-Capillary: 134 mg/dL — ABNORMAL HIGH (ref 70–99)
Glucose-Capillary: 119 mg/dL — ABNORMAL HIGH (ref 70–99)
Glucose-Capillary: 119 mg/dL — ABNORMAL HIGH (ref 70–99)
Glucose-Capillary: 130 mg/dL — ABNORMAL HIGH (ref 70–99)
Glucose-Capillary: 141 mg/dL — ABNORMAL HIGH (ref 70–99)
Glucose-Capillary: 146 mg/dL — ABNORMAL HIGH (ref 70–99)

## 2014-07-22 LAB — CLOSTRIDIUM DIFFICILE BY PCR: CDIFFPCR: NEGATIVE

## 2014-07-22 MED ORDER — ACETAMINOPHEN 160 MG/5ML PO SOLN
650.0000 mg | ORAL | Status: DC | PRN
  Administered 2014-07-22 – 2014-07-23 (×2): 650 mg
  Filled 2014-07-22 (×2): qty 20.3

## 2014-07-22 MED ORDER — VITAL AF 1.2 CAL PO LIQD
1000.0000 mL | ORAL | Status: DC
  Administered 2014-07-22 – 2014-07-29 (×8): 1000 mL
  Filled 2014-07-22 (×5): qty 1000
  Filled 2014-07-22: qty 237
  Filled 2014-07-22 (×11): qty 1000
  Filled 2014-07-22: qty 237
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
## 2014-07-22 NOTE — Progress Notes (Signed)
Patient ID: [name] LauberRhonda [name], female   DOB: 03/30/1961, 53 y.o.   MRN: [id number] Subjective:  The patient is without change. She is in no apparent distress.  Objective: Vital signs in last 24 hours: Temp:  [97.3 F (36.3 C)-99.1 F (37.3 C)] 98.8 F (37.1 C) (12/14 1146) Pulse Rate:  [69-79] 75 (12/14 1200) Resp:  [21-37] 32 (12/14 1200) BP: (119-144)/(68-89) 120/75 mmHg (12/14 1200) SpO2:  [94 %-100 %] 96 % (12/14 1200) FiO2 (%):  [40 %] 40 % (12/14 1155) Weight:  [88.6 kg (195 lb 5.2 oz)] 88.6 kg (195 lb 5.2 oz) (12/14 0500)  Intake/Output from previous day: 12/13 0701 - 12/14 0700 In: 1730 [I.V.:1200; NG/GT:480; IV Piggyback:50] Out: 2950 [Urine:2650; Stool:300] Intake/Output this shift: Total I/O In: 330 [I.V.:250; NG/GT:80] Out: 650 [Urine:650]  Physical exam Glasgow Coma Scale 9 trached, E3M5V1. The patient localizes on the left. She flexes on the right. She is right hemiparetic. Her shunt incisions are healing well.  Lab Results:  Recent Labs  07/20/14 0550  WBC 13.3*  HGB 9.8*  HCT 29.4*  PLT 281   BMET  Recent Labs  07/20/14 0550  NA 136*  K 3.5*  CL 99  CO2 26  GLUCOSE 141*  BUN 13  CREATININE 0.32*  CALCIUM 8.1*    Studies/Results: No results found.  Assessment/Plan: Status post ventriculoperitoneal shunt: She appears to be healing well.  LOS: 23 days     Miriya Cloer D 07/22/2014, 12:57 PM
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
## 2014-07-22 NOTE — Progress Notes (Signed)
UR completed.  Pt planned for SNF at d/c if she can successfully get her trach collar requirement to 28%.    Carlyle LipaMichelle Glendal Cassaday, RN BSN MHA CCM Trauma/Neuro ICU Case Manager [phone]
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
## 2014-07-23 LAB — CBC
HCT: 33.5 % — ABNORMAL LOW (ref 36.0–46.0)
Hemoglobin: 10.9 g/dL — ABNORMAL LOW (ref 12.0–15.0)
MCH: 30.4 pg (ref 26.0–34.0)
MCHC: 32.5 g/dL (ref 30.0–36.0)
MCV: 93.3 fL (ref 78.0–100.0)
Platelets: 269 10*3/uL (ref 150–400)
RBC: 3.59 MIL/uL — AB (ref 3.87–5.11)
RDW: 15.1 % (ref 11.5–15.5)
WBC: 11.8 10*3/uL — ABNORMAL HIGH (ref 4.0–10.5)

## 2014-07-23 LAB — BASIC METABOLIC PANEL
Anion gap: 10 (ref 5–15)
BUN: 12 mg/dL (ref 6–23)
CALCIUM: 8.7 mg/dL (ref 8.4–10.5)
CO2: 30 mEq/L (ref 19–32)
Chloride: 98 mEq/L (ref 96–112)
Creatinine, Ser: 0.31 mg/dL — ABNORMAL LOW (ref 0.50–1.10)
GFR calc Af Amer: >90 mL/min (ref >90)
Glucose, Bld: 154 mg/dL — ABNORMAL HIGH (ref 70–99)
Potassium: 3.4 mEq/L — ABNORMAL LOW (ref 3.7–5.3)
SODIUM: 138 meq/L (ref 137–147)

## 2014-07-23 LAB — GLUCOSE, CAPILLARY
GLUCOSE-CAPILLARY: 153 mg/dL — AB (ref 70–99)
GLUCOSE-CAPILLARY: 133 mg/dL — AB (ref 70–99)
Glucose-Capillary: 116 mg/dL — ABNORMAL HIGH (ref 70–99)
Glucose-Capillary: 134 mg/dL — ABNORMAL HIGH (ref 70–99)
Glucose-Capillary: 142 mg/dL — ABNORMAL HIGH (ref 70–99)
Glucose-Capillary: 159 mg/dL — ABNORMAL HIGH (ref 70–99)

## 2014-07-23 MED ORDER — HYDROCODONE-ACETAMINOPHEN 7.5-325 MG/15ML PO SOLN
15.0000 mL | ORAL | Status: DC | PRN
  Filled 2014-07-23: qty 15

## 2014-07-23 NOTE — Progress Notes (Signed)
Pt transferred to 4N14 per MD order. Report called to receiving nurse and all questions answered. Family aware of transfer.
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
## 2014-07-23 NOTE — Progress Notes (Signed)
Patient ID: [name] LauberRhonda Cincotta, female   DOB: 08/12/1960, 53 y.o.   MRN: [id number] Subjective:  The patient is without change. She is in no apparent distress.  Objective: Vital signs in last 24 hours: Temp:  [97.9 F (36.6 C)-99.5 F (37.5 C)] 98.4 F (36.9 C) (12/15 0700) Pulse Rate:  [73-94] 85 (12/15 0630) Resp:  [19-96] 33 (12/15 0630) BP: (119-167)/(57-100) 130/72 mmHg (12/15 0452) SpO2:  [95 %-100 %] 98 % (12/15 0630) FiO2 (%):  [28 %-40 %] 28 % (12/15 0630) Weight:  [86.3 kg (190 lb 4.1 oz)] 86.3 kg (190 lb 4.1 oz) (12/15 0403)  Intake/Output from previous day: 12/14 0701 - 12/15 0700 In: 2590.2 [I.V.:1210; NG/GT:1240.2; IV Piggyback:50] Out: 2580 [Urine:1830; Stool:750] Intake/Output this shift:    Physical exam Glasgow Coma Scale 9 trached, E3M5V1. Her scalp incision is healing well. She localizes briskly on the left. She is right hemiparetic.  Lab Results:  Recent Labs  07/23/14 0405  WBC 11.8*  HGB 10.9*  HCT 33.5*  PLT 269   BMET  Recent Labs  07/23/14 0405  NA 138  K 3.4*  CL 98  CO2 30  GLUCOSE 154*  BUN 12  CREATININE 0.31*  CALCIUM 8.7    Studies/Results: No results found.  Assessment/Plan: Status post ventricular peritoneal shunt: She is healing well. She looks like she will need nursing home placement.  LOS: 24 days     Jaycee Pelzer D 07/23/2014, 7:46 AM
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
## 2014-07-24 LAB — GLUCOSE, CAPILLARY
GLUCOSE-CAPILLARY: 132 mg/dL — AB (ref 70–99)
GLUCOSE-CAPILLARY: 137 mg/dL — AB (ref 70–99)
GLUCOSE-CAPILLARY: 160 mg/dL — AB (ref 70–99)
Glucose-Capillary: 121 mg/dL — ABNORMAL HIGH (ref 70–99)
Glucose-Capillary: 149 mg/dL — ABNORMAL HIGH (ref 70–99)
Glucose-Capillary: 159 mg/dL — ABNORMAL HIGH (ref 70–99)

## 2014-07-24 MED ORDER — POTASSIUM CHLORIDE IN NACL 20-0.9 MEQ/L-% IV SOLN
INTRAVENOUS | Status: DC
  Administered 2014-07-24 – 2014-07-27 (×4): via INTRAVENOUS
  Administered 2014-07-28 (×2): 1000 mL via INTRAVENOUS
  Filled 2014-07-24 (×8): qty 1000

## 2014-07-24 NOTE — Progress Notes (Signed)
RT note: Sutures remain in place for 7 days on Thursday 07/25/2014, orders needed for removal by RT per CCM if remain on services.

## 2014-07-24 NOTE — Clinical Social Work Placement (Addendum)
Clinical Social Work Department CLINICAL SOCIAL WORK PLACEMENT NOTE 07/24/2014  Patient:  [name] [name],[name] [name]  Account Number:  [id number] Admit date:  06/29/2014  Clinical Social Worker:  Derenda FennelBASHIRA Anesia Blackwell, CLINICAL SOCIAL WORKER  Date/time:  07/24/2014 10:50 AM  Clinical Social Work is seeking post-discharge placement for this patient at the following level of care:   SKILLED NURSING   (*CSW will update this form in Epic as items are completed)   07/24/2014  Patient/family provided with [facility] Department of Clinical Social Work's list of facilities offering this level of care within the geographic area requested by the patient (or if unable, by the patient's family).  07/24/2014  Patient/family informed of their freedom to choose among providers that offer the needed level of care, that participate in Medicare, Medicaid or managed care program needed by the patient, have an available bed and are willing to accept the patient.  07/24/2014  Patient/family informed of [facility]' ownership interest in [facility], as well as of the fact that they are under no obligation to receive care at this facility.  PASARR submitted to EDS on 07/24/2014 PASARR number received on 07/24/2014  FL2 transmitted to all facilities in geographic area requested by pt/family on  07/24/2014 FL2 transmitted to all facilities within larger geographic area on 07/24/2014  Patient informed that his/her managed care company has contracts with or will negotiate with  certain facilities, including the following:   YES.     Patient/family informed of bed offers received:  07/24/2014 [facility] bed [facility]  Physician recommends and patient chooses bed at    Patient to be transferred to [facility]  on   Patient to be transferred to facility by  Patient and family notified of transfer on  Name of family member notified:    The following physician  request were entered in Epic:   Additional Comments:   Derenda FennelBashira Densel Kronick, MSW, LCSWA [phone] 07/24/2014 10:53 AM
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
## 2014-07-24 NOTE — Progress Notes (Signed)
Patient ID: [name] [name], female   DOB: 08/28/1960, 53 y.o.   MRN: [id number] Subjective:  The patient is without change. She is in no apparent distress.  Objective: Vital signs in last 24 hours: Temp:  [98.1 F (36.7 C)-99.6 F (37.6 C)] 98.8 F (37.1 C) (12/16 1021) Pulse Rate:  [74-102] 86 (12/16 1021) Resp:  [20-32] 28 (12/16 1021) BP: (128-149)/(68-88) 140/85 mmHg (12/16 1021) SpO2:  [94 %-100 %] 97 % (12/16 1021) FiO2 (%):  [28 %-82 %] 28 % (12/16 1021) Weight:  [87.408 kg (192 lb 11.2 oz)] 87.408 kg (192 lb 11.2 oz) (12/16 0430)  Intake/Output from previous day: 12/15 0701 - 12/16 0700 In: 1035 [I.V.:450; NG/GT:585] Out: 200 [Urine:200] Intake/Output this shift:    Physical exam Glasgow Coma Scale 9, trached. She localizes on the left. She flexes on the right. She is right hemiparetic. Her shunt wounds are healing well.  Lab Results:  Recent Labs  07/23/14 0405  WBC 11.8*  HGB 10.9*  HCT 33.5*  PLT 269   BMET  Recent Labs  07/23/14 0405  NA 138  K 3.4*  CL 98  CO2 30  GLUCOSE 154*  BUN 12  CREATININE 0.31*  CALCIUM 8.7    Studies/Results: No results found.  Assessment/Plan: Postop day #7 status post VP shunt for traumatic hydrocephalus: The patient's neurologic status is stable. She will need nursing home placement.  LOS: 25 days     Malick Netz D 07/24/2014, 10:48 AM

## 2014-07-24 NOTE — Clinical Social Work Note (Signed)
Clinical Social Worker received handoff with pt's updated information and ss#. CSW has updated FL-2 and obtained PASARR number.   CSW will follow pt and pt's family for continued support and to facilitate pt's discharge once medically stable.    Derenda FennelBashira Lendell Gallick, MSW, LCSWA [phone] 07/24/2014 10:55 AM
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
## 2014-07-24 NOTE — Clinical Social Work Note (Signed)
FL-2 on chart for MD signature.   CSW continues to follow pt and pt's family for continued support and to facilitate pt's discharge needs once medically stable.   Derenda FennelBashira Elhadj Girton, MSW, LCSWA [phone] 07/24/2014 3:33 PM

## 2014-07-25 DIAGNOSIS — S27322A Contusion of lung, bilateral, initial encounter: Secondary | ICD-10-CM | POA: Diagnosis present

## 2014-07-25 DIAGNOSIS — N390 Urinary tract infection, site not specified: Secondary | ICD-10-CM

## 2014-07-25 DIAGNOSIS — S0291XA Unspecified fracture of skull, initial encounter for closed fracture: Secondary | ICD-10-CM | POA: Diagnosis present

## 2014-07-25 DIAGNOSIS — S22019A Unspecified fracture of first thoracic vertebra, initial encounter for closed fracture: Secondary | ICD-10-CM | POA: Diagnosis present

## 2014-07-25 DIAGNOSIS — S069XAA Unspecified intracranial injury with loss of consciousness status unknown, initial encounter: Secondary | ICD-10-CM

## 2014-07-25 DIAGNOSIS — S0292XA Unspecified fracture of facial bones, initial encounter for closed fracture: Secondary | ICD-10-CM | POA: Diagnosis present

## 2014-07-25 DIAGNOSIS — S069X9A Unspecified intracranial injury with loss of consciousness of unspecified duration, initial encounter: Secondary | ICD-10-CM

## 2014-07-25 DIAGNOSIS — S12600A Unspecified displaced fracture of seventh cervical vertebra, initial encounter for closed fracture: Secondary | ICD-10-CM | POA: Diagnosis present

## 2014-07-25 HISTORY — DX: Unspecified intracranial injury with loss of consciousness of unspecified duration, initial encounter: S06.9X9A

## 2014-07-25 HISTORY — DX: Urinary tract infection, site not specified: N39.0

## 2014-07-25 HISTORY — DX: Rider (driver) (passenger) of other motorcycle injured in unspecified traffic accident, initial encounter: V29.99XA

## 2014-07-25 HISTORY — DX: Unspecified intracranial injury with loss of consciousness status unknown, initial encounter: S06.9XAA

## 2014-07-25 LAB — GLUCOSE, CAPILLARY
GLUCOSE-CAPILLARY: 145 mg/dL — AB (ref 70–99)
GLUCOSE-CAPILLARY: 125 mg/dL — AB (ref 70–99)
Glucose-Capillary: 122 mg/dL — ABNORMAL HIGH (ref 70–99)
Glucose-Capillary: 144 mg/dL — ABNORMAL HIGH (ref 70–99)
Glucose-Capillary: 151 mg/dL — ABNORMAL HIGH (ref 70–99)
Glucose-Capillary: 163 mg/dL — ABNORMAL HIGH (ref 70–99)

## 2014-07-25 MED ORDER — LEVETIRACETAM 100 MG/ML PO SOLN: 500.0000 mg | Freq: Two times a day (BID) | ORAL | Status: AC

## 2014-07-25 MED ORDER — PROPRANOLOL HCL 20 MG/5ML PO SOLN: 40.0000 mg | Freq: Three times a day (TID) | ORAL | Status: AC

## 2014-07-25 MED ORDER — AMANTADINE HCL 50 MG/5ML PO SYRP: 100.0000 mg | ORAL_SOLUTION | Freq: Two times a day (BID) | ORAL | Status: AC

## 2014-07-25 MED ORDER — VITAL AF 1.2 CAL PO LIQD: 1000.0000 mL | ORAL | Status: DC

## 2014-07-25 MED ORDER — PANTOPRAZOLE SODIUM 40 MG PO PACK: 40.0000 mg | PACK | Freq: Every day | ORAL | Status: AC
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
## 2014-07-25 NOTE — Progress Notes (Signed)
Patient plans to discharge to SNF. Recommend continued SLP services at SNF including use of PMSV valve to facilitate use of upper airway for cognitive recovery, communication, and swallowing.  [name] MA, CCC-SLP [phone]

## 2014-07-25 NOTE — Progress Notes (Signed)
Chaplain responded to page from pt nurse. Family requesting prayer. Chaplain offered prayer with pt sister and niece. Family appreciates chaplain support they have received thus far. Chaplain will continue to follow.   07/25/14 1900  Clinical Encounter Type  Visited With Patient and family together  Visit Type Follow-up;Spiritual support  Referral From Nurse  Consult/Referral To Chaplain  Spiritual Encounters  Spiritual Needs Prayer;Emotional  Stress Factors  Family Stress Factors Major life changes  Delayza Lungren, Loa SocksCourtney F, Chaplain 07/25/2014 7:56 PM
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
## 2014-07-25 NOTE — Clinical Social Work Note (Signed)
[facility]. CSW notified RN and RNCM.   CSW continues to follow pt and pt's family for support and to facilitate pt's discharge needs.  Derenda FennelBashira Carter Kaman, MSW, LCSWA [phone] 07/25/2014 11:06 AM

## 2014-07-25 NOTE — Clinical Social Work Note (Addendum)
Clinical Social Worker spoke with pt's daughter, [name] in reference to discharge who then requested to speak with Trauma PA. [name] was noticeably upset because the first time she was notified of discharge was by CSW and daughter felt she should have been notified by MD first. CSW validated pt's daughter concerns and relayed the information to the treatment team. Per PA, patient is not being discharged until tomorrow (12/18). CSW notified RN, 4N Nursing Director and SNF[name].  CSW planning to meet with trauma PA, pt and pt's family tomorrow to discuss dc needs/plans. CSW continues to follow patient and pt's family.   [name] FennelBashira Chauntay [name], MSW, LCSWA [phone] 07/25/2014 4:12 PM

## 2014-07-25 NOTE — Progress Notes (Signed)
**Note De-Identified [name] [name] Obfuscation** Tracheal sutures removed.  Patient tolerated well.

## 2014-07-26 LAB — GLUCOSE, CAPILLARY
GLUCOSE-CAPILLARY: 149 mg/dL — AB (ref 70–99)
Glucose-Capillary: 125 mg/dL — ABNORMAL HIGH (ref 70–99)
Glucose-Capillary: 131 mg/dL — ABNORMAL HIGH (ref 70–99)
Glucose-Capillary: 136 mg/dL — ABNORMAL HIGH (ref 70–99)
Glucose-Capillary: 147 mg/dL — ABNORMAL HIGH (ref 70–99)
Glucose-Capillary: 148 mg/dL — ABNORMAL HIGH (ref 70–99)
Glucose-Capillary: 150 mg/dL — ABNORMAL HIGH (ref 70–99)

## 2014-07-26 NOTE — Progress Notes (Signed)
This patient will be disabled for greater than 12 months.   [name] CaldronMichael J. Serrita Lueth, PA-C Pager: [phone] General Trauma PA Pager: [phone]

## 2014-07-26 NOTE — Progress Notes (Signed)
Spoke with pt's dtr at request of SW over concerns of discharge. Dtr was very upset, felt no doctor had communicated with her in weeks, and refused discharge. Offered family meeting to address concerns, earliest time they would agree to was 1500 on Friday. Will plan to address concerns at that time.   [name] CaldronMichael J. Ingri Diemer, PA-C Pager: [phone] General Trauma PA Pager: [phone]
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
## 2014-07-26 NOTE — Progress Notes (Signed)
Trauma Service Note  Subjective: Patient is unchanged clinically and neurologically.  Objective: Vital signs in last 24 hours: Temp:  [98.3 F (36.8 C)-99.6 F (37.6 C)] 98.8 F (37.1 C) (12/18 0623) Pulse Rate:  [75-100] 100 (12/18 0820) Resp:  [16-22] 22 (12/18 0820) BP: (130-151)/(77-89) 131/82 mmHg (12/18 0623) SpO2:  [92 %-98 %] 95 % (12/18 0820) FiO2 (%):  [28 %] 28 % (12/18 0820) Weight:  [87.8 kg (193 lb 9 oz)] 87.8 kg (193 lb 9 oz) (12/18 0450) Last BM Date: 07/25/14  Intake/Output from previous day: 12/17 0701 - 12/18 0700 In: 10 [I.V.:10] Out: 800 [Urine:800] Intake/Output this shift:    General: No distress.  Lungs: Breathing easily.  Sats are 96%.  No rales or rhonchi  Abd: Tolerating tube feedings well  Extremities: No changes  Neuro: Intact  Lab Results: CBC  No results for input(s): WBC, HGB, HCT, PLT in the last 72 hours. BMET No results for input(s): NA, K, CL, CO2, GLUCOSE, BUN, CREATININE, CALCIUM in the last 72 hours. PT/INR No results for input(s): LABPROT, INR in the last 72 hours. ABG No results for input(s): PHART, HCO3 in the last 72 hours.  Invalid input(s): PCO2, PO2  Studies/Results: No results found.  Anti-infectives: Anti-infectives    Start     Dose/Rate Route Frequency Ordered Stop   07/17/14 1952  bacitracin 50,000 Units in sodium chloride irrigation 0.9 % 500 mL irrigation  Status:  Discontinued       As needed 07/17/14 2024 07/17/14 2046   07/17/14 1600  vancomycin (VANCOCIN) 1,500 mg in sodium chloride 0.9 % 500 mL IVPB  Status:  Discontinued     1,500 mg250 mL/hr over 120 Minutes Intravenous Every 8 hours 07/17/14 0842 07/17/14 1319   07/17/14 1400  cefTRIAXone (ROCEPHIN) 2 g in dextrose 5 % 50 mL IVPB - Premix     2 g100 mL/hr over 30 Minutes Intravenous Every 24 hours 07/17/14 1319 07/25/14 1402   07/15/14 0800  vancomycin (VANCOCIN) IVPB 1000 mg/200 mL premix  Status:  Discontinued     1,000 mg200 mL/hr over 60  Minutes Intravenous Every 8 hours 07/15/14 0014 07/17/14 0842   07/15/14 0600  piperacillin-tazobactam (ZOSYN) IVPB 3.375 g  Status:  Discontinued     3.375 g12.5 mL/hr over 240 Minutes Intravenous Every 8 hours 07/15/14 0014 07/17/14 1319   07/15/14 0015  vancomycin (VANCOCIN) IVPB 1000 mg/200 mL premix     1,000 mg200 mL/hr over 60 Minutes Intravenous  Once 07/15/14 0014 07/15/14 0230   07/15/14 0015  piperacillin-tazobactam (ZOSYN) IVPB 3.375 g     3.375 g100 mL/hr over 30 Minutes Intravenous  Once 07/15/14 0014 07/15/14 0159   07/07/14 1200  ceFAZolin (ANCEF) IVPB 1 g/50 mL premix  Status:  Discontinued     1 g100 mL/hr over 30 Minutes Intravenous Every 8 hours 07/07/14 1046 07/14/14 2352   07/04/14 1930  piperacillin-tazobactam (ZOSYN) IVPB 3.375 g  Status:  Discontinued     3.375 g12.5 mL/hr over 240 Minutes Intravenous Every 8 hours 07/04/14 1117 07/07/14 1044   07/04/14 1130  piperacillin-tazobactam (ZOSYN) IVPB 3.375 g     3.375 g100 mL/hr over 30 Minutes Intravenous  Once 07/04/14 1117 07/04/14 1300   07/04/14 1130  vancomycin (VANCOCIN) IVPB 1000 mg/200 mL premix  Status:  Discontinued     1,000 mg200 mL/hr over 60 Minutes Intravenous Every 12 hours 07/04/14 1117 07/07/14 1044      Assessment/Plan: s/p Procedure(s): PERCUTANEOUS TRACHEOSTOMY Family conference later today.  LOS: 27 days   Marta LamasJames O. Gae BonWyatt, III, MD, FACS [phone] Trauma Surgeon 07/26/2014

## 2014-07-26 NOTE — Clinical Social Work Note (Signed)
Clinical Social Worker met with treatment team and pt's family at length in reference to medical updates and SNF placement. CSW explained SNF consent forms and Medicaid process. CSW also advised pt's family to meet with the business office of [facility] and Rehab to review insurance coverage and potential co-pays.   Treatment team presented the risk of petitioning for legal guardianship if the family is unable to make a decision on who will sign consent documents at [facility] and Rehab.  At this time pt's family is meeting privately to discuss pt's continuity of care at [facility] and determining who will provide signatures for consent forms. Pt's family informed of hospital petitioning for legal guardianship through DSS if decision has not been made by Monday.   Pt's family is noticeably upset and tearful throughout meeting. CSW to follow pt and pt's family for continued support and to facilitate pt's discharge needs.   Glendon Axe, MSW, LCSWA [phone] 07/26/2014 4:39 PM
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
## 2014-07-26 NOTE — Clinical Social Work Note (Signed)
Clinical Social Worker has spoken to [name] [name]'s daughter, Lavella Lemons and [name] [name]'s sister, Inez Catalina several times this morning in reference to [name] [name]'s discharge plans. [name] [name]'s daughter reported that she met with [facility] and Rehab earlier this morning and expressed concerns for resuming financial responsibility once the 20 skilled days are completed. [name] [name]'s daughter further reported [facility] and Rehab's business center provided her with a Medicaid application to complete. [name] [name]'s daughter stated she is currently working on application and plans to submit application today.   CSW contacted [facility] and Rehab's admissions office which stated [name] [name]'s daughter refused to sign consent forms in regards to treatment and financial responsibility for co-pay after 20 days. Facility reported they are not willing to admit patient without consent signatures.   CSW received permission from [name] [name]'s daughter to contact [name] [name]'s sister, Inez Catalina. CSW contacted [name] [name]'s sister with updates and explained discharge plans. [name] [name]'s sister noticeably upset and reported [name] [name]'s daughter is next to kin and should resume responsibility for [name] [name]'s transition into SNF. [name] [name]'s sister further reported she can not afford co-pays and is also not agreeable to sign consent forms. [name] [name]'s sister tearful and expressed several concerns in reference to [name] [name]'s medical condition stating "she is not ready to be released".   CSW planning to meet with treatment team and [name] [name]'s family at [place] and Rehab reported they can admit [name] [name] after 3PM only if family agrees to sign all consent forms.   CSW has notified CSW supervisor and trauma RNCM, Sharyn Lull, and SNF with updates.  D/C packet completed. CSW will continue to follow [name] [name] and [name] [name]'s family.  Glendon Axe, MSW, LCSWA [phone] 07/26/2014 12:59 PM

## 2014-07-27 LAB — GLUCOSE, CAPILLARY
GLUCOSE-CAPILLARY: 151 mg/dL — AB (ref 70–99)
GLUCOSE-CAPILLARY: 141 mg/dL — AB (ref 70–99)
GLUCOSE-CAPILLARY: 143 mg/dL — AB (ref 70–99)
Glucose-Capillary: 130 mg/dL — ABNORMAL HIGH (ref 70–99)
Glucose-Capillary: 140 mg/dL — ABNORMAL HIGH (ref 70–99)

## 2014-07-27 NOTE — Progress Notes (Signed)
Writer entered the room and noted that patient is diaphoretic, vital signs checked and BP was noted to be 156/106. Patient has an order for labetalol, was given per order. BS checked was within normal range. MD notified.

## 2014-07-27 NOTE — Progress Notes (Signed)
Patient ID: [name] LauberRhonda [name], female   DOB: 06/17/1961, 53 y.o.   MRN: [id number] Patient demonstrates more eye opening today. Nonetheless she doesn't respond to threat and her stair is very blank area patient's family appears encouraged by the patient's increased alertness
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
## 2014-07-28 LAB — GLUCOSE, CAPILLARY
GLUCOSE-CAPILLARY: 136 mg/dL — AB (ref 70–99)
GLUCOSE-CAPILLARY: 144 mg/dL — AB (ref 70–99)
GLUCOSE-CAPILLARY: 145 mg/dL — AB (ref 70–99)
Glucose-Capillary: 151 mg/dL — ABNORMAL HIGH (ref 70–99)
Glucose-Capillary: 143 mg/dL — ABNORMAL HIGH (ref 70–99)
Glucose-Capillary: 138 mg/dL — ABNORMAL HIGH (ref 70–99)

## 2014-07-28 NOTE — Social Work (Signed)
CSW met with patient's sister and niece.  Niece, Carloyn Jaeger, [phone] ([phone]), [phone]), stated that she would assume financial responsibility for the patient to be admited for skilled care.  Patient's sister, Inez Catalina understood that she was to come to [facility] and see the CSW in order to make arrangements. CSW contacted facility, [facility] and rehab and spoke with Butch Penny who contacted the admissions coordinators about admission plan. Oval Linsey called back and stated that they did not have access to paperwork on the weekend. CSW and Inez Catalina identified that Inez Catalina would present at [facility] and rehab in order to confirm the paperwork and needs for patient to be admitted.   Christene Lye MSW, [place]
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
## 2014-07-29 LAB — GLUCOSE, CAPILLARY
GLUCOSE-CAPILLARY: 120 mg/dL — AB (ref 70–99)
GLUCOSE-CAPILLARY: 144 mg/dL — AB (ref 70–99)
GLUCOSE-CAPILLARY: 145 mg/dL — AB (ref 70–99)
Glucose-Capillary: 145 mg/dL — ABNORMAL HIGH (ref 70–99)
Glucose-Capillary: 138 mg/dL — ABNORMAL HIGH (ref 70–99)

## 2014-07-29 LAB — CLOSTRIDIUM DIFFICILE BY PCR: Toxigenic C. Difficile by PCR: NEGATIVE

## 2014-07-29 MED ORDER — PRO-STAT SUGAR FREE PO LIQD
30.0000 mL | Freq: Every day | ORAL | Status: DC
  Filled 2014-07-29: qty 30

## 2014-07-29 MED ORDER — OSMOLITE 1.2 CAL PO LIQD
1000.0000 mL | ORAL | Status: DC
  Filled 2014-07-29 (×2): qty 1000

## 2014-07-29 NOTE — Progress Notes (Signed)
Patient is having large watery stool from peg feeding and she is starting to have skin breakdown from contact with stool. Paged on call trama MD for a order for a Flexi-seal awaiting a  response.  Dr Duane BostonWakeield OK'd phone order for the flex-seal. Will install it as soon as I receive it.

## 2014-07-29 NOTE — Clinical Social Work Note (Signed)
Clinical Social Worker spoke with pt's sister, Inez Catalina in reference to disposition. Pt's sister reported she met with [facility] earlier this morning and is currently in the process of completing admissions paperwork. Pt's sister further reported she had to travel to [facility] to complete paperwork with her daughter (pt's niece). Pt's sister stated she will return paperwork to [place] as soon as it is completed.   CSW to follow pt for disposition.   Glendon Axe, MSW, LCSWA [phone] 07/29/2014 11:30 AM

## 2014-07-29 NOTE — Progress Notes (Signed)
Per CW, patient is ready to be discharged. Report given to Velna HatchetSheila, LPN at [facility] at [phone]. Daughter, [name], called and aware of patient's being [facility]. All belongings packed. Awaiting for transport.  Sim BoastHavy, RN
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
## 2014-07-29 NOTE — Progress Notes (Signed)
Patient with order for Rectal tube. Patient is currently on vital tube feeding product. She is incontinent and has had a small loose yellow stool at this time. MD Paged to reassess rectal tube insertion. Will continue to monitor.   Sim BoastHavy, RN

## 2014-07-29 NOTE — Clinical Social Work Note (Addendum)
Clinical Social Worker facilitated patient discharge including contacting patient family and facility to confirm patient discharge plans.  Clinical information faxed to facility and family agreeable with plan.  CSW arranged ambulance transport via [facility].  RN to call report prior to discharge.  [facility] reported/confirmed that facility received insurance approval for patient to be admitted. Pt's niece signed consent forms. Pt's sister advised to complete Medicaid/disability application at county DSS.  DC Packet completed.     Clinical Social Worker will sign off for now as social work intervention is no longer needed. Please consult us again if new need arises.   Derenda FennelBashira Giulietta Prokop, MSW, LCSWA [phone] 07/29/2014 1:14 PM

## 2014-07-29 NOTE — Progress Notes (Signed)
No Flexi-seals in hospital at this time one will be brought up when it arrives.
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
## 2014-07-29 NOTE — Anesthesia Postprocedure Evaluation (Signed)
  Anesthesia Post-op Note  Patient: [name] LauberRhonda Mousseau  Procedure(s) Performed: Procedure(s): SHUNT INSERTION VENTRICULAR-PERITONEAL (Right)  Pt continuing in rehab s/p traumatic brain injury.  No apparent anesthetic complications.

## 2014-07-30 ENCOUNTER — Inpatient Hospital Stay (HOSPITAL_COMMUNITY)
Admission: AD | Admit: 2014-07-30 | Discharge: 2014-08-07 | DRG: 177 | Disposition: A | Discharge status: Other Acute Inpatient Hospital | Payer: No Typology Code available for payment source | Source: Other Acute Inpatient Hospital | Attending: Family Medicine | Admitting: Family Medicine

## 2014-07-30 DIAGNOSIS — Z9889 Other specified postprocedural states: Secondary | ICD-10-CM | POA: Diagnosis not present

## 2014-07-30 DIAGNOSIS — R1310 Dysphagia, unspecified: Secondary | ICD-10-CM | POA: Diagnosis present

## 2014-07-30 DIAGNOSIS — J690 Pneumonitis due to inhalation of food and vomit: Secondary | ICD-10-CM | POA: Diagnosis present

## 2014-07-30 DIAGNOSIS — Y95 Nosocomial condition: Secondary | ICD-10-CM | POA: Diagnosis present

## 2014-07-30 DIAGNOSIS — B3749 Other urogenital candidiasis: Secondary | ICD-10-CM | POA: Diagnosis not present

## 2014-07-30 DIAGNOSIS — I4891 Unspecified atrial fibrillation: Secondary | ICD-10-CM | POA: Diagnosis present

## 2014-07-30 DIAGNOSIS — E875 Hyperkalemia: Secondary | ICD-10-CM | POA: Diagnosis not present

## 2014-07-30 DIAGNOSIS — J1520 Pneumonia due to staphylococcus, unspecified: Secondary | ICD-10-CM | POA: Diagnosis present

## 2014-07-30 DIAGNOSIS — R739 Hyperglycemia, unspecified: Secondary | ICD-10-CM | POA: Diagnosis not present

## 2014-07-30 DIAGNOSIS — L8990 Pressure ulcer of unspecified site, unspecified stage: Secondary | ICD-10-CM | POA: Diagnosis present

## 2014-07-30 DIAGNOSIS — I639 Cerebral infarction, unspecified: Secondary | ICD-10-CM | POA: Diagnosis present

## 2014-07-30 DIAGNOSIS — S069X9A Unspecified intracranial injury with loss of consciousness of unspecified duration, initial encounter: Secondary | ICD-10-CM | POA: Diagnosis present

## 2014-07-30 DIAGNOSIS — K219 Gastro-esophageal reflux disease without esophagitis: Secondary | ICD-10-CM | POA: Diagnosis present

## 2014-07-30 DIAGNOSIS — G40209 Localization-related (focal) (partial) symptomatic epilepsy and epileptic syndromes with complex partial seizures, not intractable, without status epilepticus: Secondary | ICD-10-CM | POA: Diagnosis present

## 2014-07-30 DIAGNOSIS — L89159 Pressure ulcer of sacral region, unspecified stage: Secondary | ICD-10-CM | POA: Diagnosis present

## 2014-07-30 DIAGNOSIS — Z87820 Personal history of traumatic brain injury: Secondary | ICD-10-CM

## 2014-07-30 DIAGNOSIS — Z515 Encounter for palliative care: Secondary | ICD-10-CM

## 2014-07-30 DIAGNOSIS — E46 Unspecified protein-calorie malnutrition: Secondary | ICD-10-CM | POA: Diagnosis present

## 2014-07-30 DIAGNOSIS — Z79899 Other long term (current) drug therapy: Secondary | ICD-10-CM | POA: Diagnosis not present

## 2014-07-30 DIAGNOSIS — S0291XD Unspecified fracture of skull, subsequent encounter for fracture with routine healing: Secondary | ICD-10-CM | POA: Diagnosis not present

## 2014-07-30 DIAGNOSIS — I480 Paroxysmal atrial fibrillation: Secondary | ICD-10-CM | POA: Diagnosis present

## 2014-07-30 DIAGNOSIS — Z931 Gastrostomy status: Secondary | ICD-10-CM

## 2014-07-30 DIAGNOSIS — J189 Pneumonia, unspecified organism: Secondary | ICD-10-CM | POA: Diagnosis present

## 2014-07-30 DIAGNOSIS — J9621 Acute and chronic respiratory failure with hypoxia: Secondary | ICD-10-CM | POA: Diagnosis present

## 2014-07-30 DIAGNOSIS — S0292XA Unspecified fracture of facial bones, initial encounter for closed fracture: Secondary | ICD-10-CM | POA: Diagnosis present

## 2014-07-30 DIAGNOSIS — Z930 Tracheostomy status: Secondary | ICD-10-CM

## 2014-07-30 DIAGNOSIS — S0292XS Unspecified fracture of facial bones, sequela: Secondary | ICD-10-CM

## 2014-07-30 DIAGNOSIS — R4701 Aphasia: Secondary | ICD-10-CM | POA: Diagnosis present

## 2014-07-30 DIAGNOSIS — S069X0S Unspecified intracranial injury without loss of consciousness, sequela: Secondary | ICD-10-CM | POA: Diagnosis not present

## 2014-07-30 DIAGNOSIS — J9690 Respiratory failure, unspecified, unspecified whether with hypoxia or hypercapnia: Secondary | ICD-10-CM

## 2014-07-30 DIAGNOSIS — Z982 Presence of cerebrospinal fluid drainage device: Secondary | ICD-10-CM | POA: Diagnosis not present

## 2014-07-30 DIAGNOSIS — S069XAA Unspecified intracranial injury with loss of consciousness status unknown, initial encounter: Secondary | ICD-10-CM | POA: Diagnosis present

## 2014-07-30 DIAGNOSIS — S0291XA Unspecified fracture of skull, initial encounter for closed fracture: Secondary | ICD-10-CM | POA: Diagnosis present

## 2014-07-30 DIAGNOSIS — R531 Weakness: Secondary | ICD-10-CM

## 2014-07-30 HISTORY — DX: Presence of cerebrospinal fluid drainage device: Z98.2

## 2014-07-30 HISTORY — DX: Cerebral infarction, unspecified: I63.9

## 2014-07-30 HISTORY — DX: Tracheostomy status: Z93.0

## 2014-07-30 HISTORY — DX: Gastrostomy status: Z93.1

## 2014-07-30 HISTORY — DX: Urinary tract infection, site not specified: N39.0

## 2014-07-30 HISTORY — DX: Hydrocephalus, unspecified: G91.9

## 2014-07-30 HISTORY — DX: Unspecified convulsions: R56.9

## 2014-07-30 HISTORY — DX: Unspecified intracranial injury with loss of consciousness of unspecified duration, initial encounter: S06.9X9A

## 2014-07-30 HISTORY — DX: Paroxysmal atrial fibrillation: I48.0
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
## 2014-07-30 NOTE — Progress Notes (Signed)
Pt admitted from [facility] to [phone]. [facility] per daughter. She stated that she "did not think the pt was ready to go home but the MDs disagreed." Pt on Trach collar with increased, dark, thick secretions. Pt has Peg tube in place and #6 shiley. Pt is alert with eyes open but not following commands or making any mouth movement. MD paged to make aware that pt is on the unit. Daughter not present but called and left her number.

## 2014-07-31 ENCOUNTER — Encounter (HOSPITAL_COMMUNITY): Payer: Self-pay | Admitting: Family Medicine

## 2014-07-31 DIAGNOSIS — I480 Paroxysmal atrial fibrillation: Secondary | ICD-10-CM

## 2014-07-31 DIAGNOSIS — J189 Pneumonia, unspecified organism: Secondary | ICD-10-CM | POA: Diagnosis present

## 2014-07-31 DIAGNOSIS — Z930 Tracheostomy status: Secondary | ICD-10-CM

## 2014-07-31 DIAGNOSIS — S069X5D Unspecified intracranial injury with loss of consciousness greater than 24 hours with return to pre-existing conscious level, subsequent encounter: Secondary | ICD-10-CM

## 2014-07-31 DIAGNOSIS — J690 Pneumonitis due to inhalation of food and vomit: Secondary | ICD-10-CM | POA: Diagnosis present

## 2014-07-31 DIAGNOSIS — L8990 Pressure ulcer of unspecified site, unspecified stage: Secondary | ICD-10-CM | POA: Diagnosis present

## 2014-07-31 LAB — URINALYSIS, ROUTINE W REFLEX MICROSCOPIC
BILIRUBIN URINE: NEGATIVE
Glucose, UA: NEGATIVE mg/dL
Ketones, ur: NEGATIVE mg/dL
Nitrite: NEGATIVE
PH: 6.5 (ref 5.0–8.0)
Protein, ur: NEGATIVE mg/dL
SPECIFIC GRAVITY, URINE: 1.033 — AB (ref 1.005–1.030)
Urobilinogen, UA: 0.2 mg/dL (ref 0.0–1.0)

## 2014-07-31 LAB — COMPREHENSIVE METABOLIC PANEL
ALT: 41 U/L — AB (ref 0–35)
AST: 15 U/L (ref 0–37)
Albumin: 2.6 g/dL — ABNORMAL LOW (ref 3.5–5.2)
Alkaline Phosphatase: 128 U/L — ABNORMAL HIGH (ref 39–117)
Anion gap: 9 (ref 5–15)
BILIRUBIN TOTAL: 0.5 mg/dL (ref 0.3–1.2)
BUN: 10 mg/dL (ref 6–23)
CHLORIDE: 98 meq/L (ref 96–112)
CO2: 27 mmol/L (ref 19–32)
CREATININE: 0.36 mg/dL — AB (ref 0.50–1.10)
Calcium: 9.4 mg/dL (ref 8.4–10.5)
GFR calc Af Amer: >90 mL/min (ref >90)
GFR calc non Af Amer: >90 mL/min (ref >90)
Glucose, Bld: 111 mg/dL — ABNORMAL HIGH (ref 70–99)
POTASSIUM: 4.1 mmol/L (ref 3.5–5.1)
SODIUM: 134 mmol/L — AB (ref 135–145)
Total Protein: 6.5 g/dL (ref 6.0–8.3)

## 2014-07-31 LAB — STREP PNEUMONIAE URINARY ANTIGEN: STREP PNEUMO URINARY ANTIGEN: POSITIVE — AB

## 2014-07-31 LAB — CBC
HCT: 38.8 % (ref 36.0–46.0)
Hemoglobin: 12.3 g/dL (ref 12.0–15.0)
MCH: 29.9 pg (ref 26.0–34.0)
MCHC: 31.7 g/dL (ref 30.0–36.0)
MCV: 94.2 fL (ref 78.0–100.0)
PLATELETS: 247 10*3/uL (ref 150–400)
RBC: 4.12 MIL/uL (ref 3.87–5.11)
RDW: 15.4 % (ref 11.5–15.5)
WBC: 8.2 10*3/uL (ref 4.0–10.5)

## 2014-07-31 LAB — GLUCOSE, CAPILLARY
GLUCOSE-CAPILLARY: 122 mg/dL — AB (ref 70–99)
GLUCOSE-CAPILLARY: 134 mg/dL — AB (ref 70–99)
Glucose-Capillary: 144 mg/dL — ABNORMAL HIGH (ref 70–99)
Glucose-Capillary: 122 mg/dL — ABNORMAL HIGH (ref 70–99)
Glucose-Capillary: 124 mg/dL — ABNORMAL HIGH (ref 70–99)

## 2014-07-31 LAB — HIV ANTIBODY (ROUTINE TESTING W REFLEX): HIV 1&2 Ab, 4th Generation: NONREACTIVE

## 2014-07-31 LAB — URINE MICROSCOPIC-ADD ON

## 2014-07-31 LAB — LACTIC ACID, PLASMA: Lactic Acid, Venous: 1.1 mmol/L (ref 0.5–2.2)

## 2014-07-31 LAB — MRSA PCR SCREENING: MRSA by PCR: NEGATIVE

## 2014-07-31 MED ORDER — CETYLPYRIDINIUM CHLORIDE 0.05 % MT LIQD
7.0000 mL | Freq: Two times a day (BID) | OROMUCOSAL | Status: DC
  Administered 2014-07-31 – 2014-08-07 (×16): 7 mL via OROMUCOSAL

## 2014-07-31 MED ORDER — VITAL AF 1.2 CAL PO LIQD
1000.0000 mL | ORAL | Status: DC
  Administered 2014-07-31 – 2014-08-04 (×6): 1000 mL
  Filled 2014-07-31 (×11): qty 1000

## 2014-07-31 MED ORDER — VANCOMYCIN HCL IN DEXTROSE 1-5 GM/200ML-% IV SOLN
1000.0000 mg | Freq: Once | INTRAVENOUS | Status: AC
  Administered 2014-07-31: 1000 mg via INTRAVENOUS
  Filled 2014-07-31: qty 200

## 2014-07-31 MED ORDER — HYDRALAZINE HCL 20 MG/ML IJ SOLN
5.0000 mg | INTRAMUSCULAR | Status: DC | PRN
  Administered 2014-07-31: 5 mg via INTRAVENOUS
  Filled 2014-07-31: qty 1

## 2014-07-31 MED ORDER — DEXTROSE 5 % IV SOLN
1.0000 g | Freq: Three times a day (TID) | INTRAVENOUS | Status: DC
  Administered 2014-07-31 – 2014-08-02 (×8): 1 g via INTRAVENOUS
  Filled 2014-07-31 (×10): qty 1

## 2014-07-31 MED ORDER — LEVETIRACETAM 100 MG/ML PO SOLN
500.0000 mg | Freq: Two times a day (BID) | ORAL | Status: DC
  Administered 2014-07-31 – 2014-08-07 (×17): 500 mg
  Filled 2014-07-31 (×17): qty 5

## 2014-07-31 MED ORDER — PANTOPRAZOLE SODIUM 40 MG PO PACK
40.0000 mg | PACK | Freq: Every day | ORAL | Status: DC
  Administered 2014-07-31 – 2014-08-07 (×8): 40 mg
  Filled 2014-07-31 (×8): qty 20

## 2014-07-31 MED ORDER — ONDANSETRON HCL 4 MG PO TABS: 4.0000 mg | ORAL_TABLET | Freq: Four times a day (QID) | ORAL | Status: DC | PRN

## 2014-07-31 MED ORDER — HEPARIN SODIUM (PORCINE) 5000 UNIT/ML IJ SOLN
5000.0000 [IU] | Freq: Three times a day (TID) | INTRAMUSCULAR | Status: DC
  Administered 2014-07-31 – 2014-08-07 (×24): 5000 [IU] via SUBCUTANEOUS
  Filled 2014-07-31 (×25): qty 1

## 2014-07-31 MED ORDER — VANCOMYCIN HCL IN DEXTROSE 750-5 MG/150ML-% IV SOLN
750.0000 mg | Freq: Three times a day (TID) | INTRAVENOUS | Status: DC
  Administered 2014-07-31 – 2014-08-01 (×4): 750 mg via INTRAVENOUS
  Filled 2014-07-31 (×5): qty 150

## 2014-07-31 MED ORDER — PROPRANOLOL HCL 20 MG/5ML PO SOLN
40.0000 mg | Freq: Three times a day (TID) | ORAL | Status: DC
  Administered 2014-07-31 – 2014-08-07 (×25): 40 mg
  Filled 2014-07-31 (×25): qty 10

## 2014-07-31 MED ORDER — SODIUM CHLORIDE 0.9 % IJ SOLN
3.0000 mL | Freq: Two times a day (BID) | INTRAMUSCULAR | Status: DC
  Administered 2014-07-31 – 2014-08-07 (×8): 3 mL via INTRAVENOUS

## 2014-07-31 MED ORDER — ONDANSETRON HCL 4 MG/2ML IJ SOLN: 4.0000 mg | Freq: Four times a day (QID) | INTRAMUSCULAR | Status: DC | PRN

## 2014-07-31 MED ORDER — CHLORHEXIDINE GLUCONATE 0.12 % MT SOLN
15.0000 mL | Freq: Two times a day (BID) | OROMUCOSAL | Status: DC
  Administered 2014-07-31 – 2014-08-07 (×14): 15 mL via OROMUCOSAL
  Filled 2014-07-31 (×18): qty 15

## 2014-07-31 MED ORDER — AMANTADINE HCL 50 MG/5ML PO SYRP
100.0000 mg | ORAL_SOLUTION | Freq: Two times a day (BID) | ORAL | Status: DC
  Administered 2014-07-31 – 2014-08-07 (×17): 100 mg
  Filled 2014-07-31 (×17): qty 10

## 2014-07-31 MED ORDER — INSULIN ASPART 100 UNIT/ML ~~LOC~~ SOLN
0.0000 [IU] | SUBCUTANEOUS | Status: DC
  Administered 2014-07-31 – 2014-08-01 (×5): 1 [IU] via SUBCUTANEOUS
  Administered 2014-08-01: 2 [IU] via SUBCUTANEOUS
  Administered 2014-08-01 – 2014-08-02 (×5): 1 [IU] via SUBCUTANEOUS
  Administered 2014-08-02: 2 [IU] via SUBCUTANEOUS
  Administered 2014-08-02 – 2014-08-04 (×8): 1 [IU] via SUBCUTANEOUS
  Administered 2014-08-04 (×2): 2 [IU] via SUBCUTANEOUS
  Administered 2014-08-04 – 2014-08-05 (×7): 1 [IU] via SUBCUTANEOUS
  Administered 2014-08-05: 2 [IU] via SUBCUTANEOUS
  Administered 2014-08-06 – 2014-08-07 (×9): 1 [IU] via SUBCUTANEOUS
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
## 2014-07-31 NOTE — Progress Notes (Signed)
NP made aware that pt is diaphoretic, temp increased to 99.6 axillary and BP trending up currently 170/90. Pt is unable to state if she is having pain.
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
## 2014-07-31 NOTE — Consult Note (Signed)
WOC wound consult note Reason for Consult: Consult requested for buttocks.  Pressure Ulcer POA: Yes this was present on admission; this is NOT a pressure ulcer, it is a partial thickness fissure in the gluteal fold related to prolonged moisture. Measurement: 6X1X.1cm Wound bed: pink and moist Drainage (amount, consistency, odor) Small amt yellow drainage Periwound: Intact skin surrounding Dressing procedure/placement/frequency: Foam dressing to protect, wick moisture away from skin, and promote healing. Please re-consult if further assistance is needed.  Thank-you,  Cammie Mcgeeawn Jacoby Ritsema MSN, RN, CWOCN, [place]WCN-AP, CNS [phone]

## 2014-07-31 NOTE — Progress Notes (Addendum)
ANTIBIOTIC CONSULT NOTE - INITIAL  Pharmacy Consult for Vancomycin  Indication: rule out pneumonia  Not on File  Patient Measurements: Height: 5\' 8"  (172.7 cm) Weight: 186 lb (84.369 kg) IBW/kg (Calculated) : 63.9   Vital Signs: Temp: 98 F (36.7 C) (12/23 0015) Temp Source: Axillary (12/23 0015) BP: 136/82 mmHg (12/23 0015) Pulse Rate: 106 (12/23 0015)  Assessment: Broad spectrum anti-biotics for r/o PNA  Goal of Therapy:  Vancomycin trough level 15-20 mcg/ml  Plan:  -Vancomycin 1000 mg IV x 1, then f/u Scr for additional dosing -Cefepime per MD -Drug levels as indicated   Domonic Hiscox 07/31/2014,12:56 AM   Addendum 3:03 AM  Scr 0.36 -Vancomycin 750 mg IV q8h Wilmer FloorJLedford, PharmD
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
## 2014-08-01 LAB — BASIC METABOLIC PANEL
Anion gap: 9 (ref 5–15)
BUN: 12 mg/dL (ref 6–23)
CALCIUM: 9.5 mg/dL (ref 8.4–10.5)
CO2: 31 mmol/L (ref 19–32)
CREATININE: 0.46 mg/dL — AB (ref 0.50–1.10)
Chloride: 96 mEq/L (ref 96–112)
GFR calc Af Amer: >90 mL/min (ref >90)
GFR calc non Af Amer: >90 mL/min (ref >90)
GLUCOSE: 137 mg/dL — AB (ref 70–99)
Potassium: 3.6 mmol/L (ref 3.5–5.1)
Sodium: 136 mmol/L (ref 135–145)

## 2014-08-01 LAB — CBC
HCT: 39.8 % (ref 36.0–46.0)
Hemoglobin: 12.9 g/dL (ref 12.0–15.0)
MCH: 30.4 pg (ref 26.0–34.0)
MCHC: 32.4 g/dL (ref 30.0–36.0)
MCV: 93.6 fL (ref 78.0–100.0)
Platelets: 271 10*3/uL (ref 150–400)
RBC: 4.25 MIL/uL (ref 3.87–5.11)
RDW: 15.4 % (ref 11.5–15.5)
WBC: 8.3 10*3/uL (ref 4.0–10.5)

## 2014-08-01 LAB — GLUCOSE, CAPILLARY
GLUCOSE-CAPILLARY: 123 mg/dL — AB (ref 70–99)
GLUCOSE-CAPILLARY: 118 mg/dL — AB (ref 70–99)
Glucose-Capillary: 135 mg/dL — ABNORMAL HIGH (ref 70–99)
Glucose-Capillary: 136 mg/dL — ABNORMAL HIGH (ref 70–99)
Glucose-Capillary: 137 mg/dL — ABNORMAL HIGH (ref 70–99)
Glucose-Capillary: 152 mg/dL — ABNORMAL HIGH (ref 70–99)

## 2014-08-01 LAB — LEGIONELLA ANTIGEN, URINE

## 2014-08-01 MED ORDER — SODIUM CHLORIDE 0.9 % IV SOLN
1250.0000 mg | Freq: Three times a day (TID) | INTRAVENOUS | Status: DC
  Administered 2014-08-01 – 2014-08-02 (×2): 1250 mg via INTRAVENOUS
  Filled 2014-08-01 (×4): qty 1250

## 2014-08-01 NOTE — Progress Notes (Signed)
Patient diaphoretic heart rate up 130-140's sustaining blood pressure 146/84.Patient has increase in thick tan tracheal secretions tonight.12 lead ekg obtained to check for any rhythm changes.12 lead shows sinus tachycardia.Bedtime dose of propranolol given to patient.Respiratory therapy called to assess patient.

## 2014-08-01 NOTE — Clinical Social Work Note (Signed)
Clinical Social Work Department BRIEF PSYCHOSOCIAL ASSESSMENT 08/01/2014  Patient:  [name] [name],[name] [name]     Account Number:  [id number]     Admit date:  07/30/2014  Clinical Social Worker:  Merlyn LotHOLOMAN,Leory Allinson, CLINICAL SOCIAL WORKER  Date/Time:  08/01/2014 11:38 AM  Referred by:  Physician  Date Referred:  08/01/2014 Referred for  SNF Placement   Other Referral:   Interview type:  Family Other interview type:    PSYCHOSOCIAL DATA Living Status:  FACILITY Admitted from facility:  [facility] Level of care:  Skilled Nursing Facility Primary support name:  [name] [name] Primary support relationship to patient:  CHILD, ADULT Degree of support available:   high level of support from son and daughter    CURRENT CONCERNS Current Concerns  Post-Acute Placement   Other Concerns:    SOCIAL WORK ASSESSMENT / PLAN CSW spoke to patients daughter [facility] [place]- patients daughter would like to look into [facility] as an option.  CSW will continue to follow.   Assessment/plan status:  Psychosocial Support/Ongoing Assessment of Needs Other assessment/ plan:   FL2   Information/referral to community resources:   [facility]    PATIENT'S/FAMILY'S RESPONSE TO PLAN OF CARE: Patients daughter is appreciative of CSW involvement and is agreeable to patient going back to a SNF of some kind.       Merlyn LotJenna Holoman, LCSWA Clinical Social Worker [phone]

## 2014-08-01 NOTE — Progress Notes (Signed)
NP Camila Lisman notified of increase of tracheal secretions and elevated heart rate and diaphoresis.Patient heart rate now 100-118 remains sinus tachycardia.Will continue to monitor patient.
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
## 2014-08-01 NOTE — Progress Notes (Signed)
**Note De-Identified Haddon Fyfe Obfuscation** Patient removed from 28% ATC to room air per Dr. Rito EhrlichKrishnan.  Patient tolerating well SAT (&%.  RRT to continue to monitor.

## 2014-08-01 NOTE — Progress Notes (Signed)
ANTIBIOTIC CONSULT NOTE - Follow-up  Pharmacy Consult for Vancomycin  Indication: rule out pneumonia  No Known Allergies  Patient Measurements: Height: 5\' 8"  (172.7 cm) Weight: 186 lb (84.369 kg) IBW/kg (Calculated) : 63.9   Vital Signs: Temp: 97.2 F (36.2 C) (12/24 1202) Temp Source: Axillary (12/24 1202) BP: 116/85 mmHg (12/24 1202) Pulse Rate: 79 (12/24 1202)  Assessment: 53 yof presented to the hospital with hypoxis and fever after a recent hospital discharge. Started on empiric vancomycin + cefepime for treatment of possible pneumonia. Pt is afebrile and WBC is WNL. Strep pneumo is positive. During a recent admission, patients vancomycin trough was subtherapeutic on 1gm Q8H.  Cefepime 12/22>> Vanc 12/22>>  12/23 BCx2>>NGTD 12/23 Sputum - NPOF   Goal of Therapy:  Vancomycin trough level 15-20 mcg/ml  Plan:  1. Change vancomycin to 1250mg  IV Q8H 2. F/u renal fxn, C&S, clinical status and trough at SS 3. MD - Consider de-escalating  Lysle Pearlachel Oral Remache, PharmD, BCPS Pager # [phone] 08/01/2014 12:57 PM

## 2014-08-01 NOTE — Progress Notes (Signed)
Pt is very diaphoretic with HR in the 120's breathing seems slightly labored RT bag lavaged pt and got back very little secretions. RT will continue to monitor

## 2014-08-01 NOTE — Clinical Social Work Placement (Signed)
Clinical Social Work Department CLINICAL SOCIAL WORK PLACEMENT NOTE 08/01/2014  Patient:  Kundinger,Shalondra  Account Number:  [id number] Admit date:  07/30/2014  Clinical Social Worker:  Merlyn LotJENNA HOLOMAN, CLINICAL SOCIAL WORKER  Date/time:  08/01/2014 11:40 AM  Clinical Social Work is seeking post-discharge placement for this patient at the following level of care:   SKILLED NURSING   (*CSW will update this form in Epic as items are completed)   08/01/2014  Patient/family provided with [facility] Work's list of facilities offering this level of care within the geographic area requested by the patient (or if unable, by the patient's family).  08/01/2014  Patient/family informed of their freedom to choose among providers that offer the needed level of care, that participate in Medicare, Medicaid or managed care program needed by the patient, have an available bed and are willing to accept the patient.  08/01/2014  Patient/family informed of [facility]' ownership interest in [facility], as well as of the fact that they are under no obligation to receive care at this facility.  PASARR submitted to EDS on 08/01/2014 PASARR number received on 08/01/2014  FL2 transmitted to all facilities in geographic area requested by pt/family on  08/01/2014 FL2 transmitted to all facilities within larger geographic area on   Patient informed that his/her managed care company has contracts with or will negotiate with  certain facilities, including the following:     Patient/family informed of bed offers received:   Patient chooses bed at  Physician recommends and patient chooses bed at    Patient to be transferred to  on   Patient to be transferred to facility by  Patient and family notified of transfer on  Name of family member notified:    The following physician request were entered in Epic:   Additional Comments: Merlyn LotJenna Holoman, [facility] Clinical Social  Worker [phone]

## 2014-08-01 NOTE — Progress Notes (Signed)
Had 1 lge stool this shift. Order given for rectal tube. Since having only 1 stool will continue with monitoring. Night shift made aware.

## 2014-08-01 NOTE — Progress Notes (Signed)
**Note De-Identified Sarin Comunale Obfuscation** Patient placed on 21% ATC with compressed air flow meter to deliver appropriate humidity for prevention of plugging.
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
## 2014-08-02 ENCOUNTER — Inpatient Hospital Stay (HOSPITAL_COMMUNITY): Payer: No Typology Code available for payment source

## 2014-08-02 DIAGNOSIS — B953 Streptococcus pneumoniae as the cause of diseases classified elsewhere: Secondary | ICD-10-CM

## 2014-08-02 LAB — BLOOD GAS, ARTERIAL
Acid-Base Excess: 3.5 mmol/L — ABNORMAL HIGH (ref 0.0–2.0)
BICARBONATE: 26.8 meq/L — AB (ref 20.0–24.0)
Drawn by: 307971
FIO2: 0.21 %
O2 SAT: 95.4 %
PH ART: 7.489 — AB (ref 7.350–7.450)
Patient temperature: 98.6
TCO2: 27.9 mmol/L (ref 0–100)
pCO2 arterial: 35.7 mmHg (ref 35.0–45.0)
pO2, Arterial: 74.2 mmHg — ABNORMAL LOW (ref 80.0–100.0)

## 2014-08-02 LAB — GLUCOSE, CAPILLARY
GLUCOSE-CAPILLARY: 147 mg/dL — AB (ref 70–99)
GLUCOSE-CAPILLARY: 145 mg/dL — AB (ref 70–99)
Glucose-Capillary: 130 mg/dL — ABNORMAL HIGH (ref 70–99)
Glucose-Capillary: 146 mg/dL — ABNORMAL HIGH (ref 70–99)
Glucose-Capillary: 148 mg/dL — ABNORMAL HIGH (ref 70–99)
Glucose-Capillary: 151 mg/dL — ABNORMAL HIGH (ref 70–99)

## 2014-08-02 LAB — CULTURE, RESPIRATORY W GRAM STAIN

## 2014-08-02 LAB — CULTURE, RESPIRATORY

## 2014-08-02 MED ORDER — CEFTRIAXONE SODIUM IN DEXTROSE 20 MG/ML IV SOLN
1.0000 g | INTRAVENOUS | Status: DC
  Administered 2014-08-02 – 2014-08-05 (×4): 1 g via INTRAVENOUS
  Filled 2014-08-02 (×6): qty 50
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
## 2014-08-03 ENCOUNTER — Encounter (HOSPITAL_COMMUNITY): Payer: Self-pay | Admitting: *Deleted

## 2014-08-03 ENCOUNTER — Inpatient Hospital Stay (HOSPITAL_COMMUNITY): Payer: No Typology Code available for payment source

## 2014-08-03 DIAGNOSIS — J189 Pneumonia, unspecified organism: Secondary | ICD-10-CM

## 2014-08-03 DIAGNOSIS — J154 Pneumonia due to other streptococci: Secondary | ICD-10-CM

## 2014-08-03 DIAGNOSIS — Z930 Tracheostomy status: Secondary | ICD-10-CM

## 2014-08-03 LAB — BASIC METABOLIC PANEL
Anion gap: 9 (ref 5–15)
BUN: 11 mg/dL (ref 6–23)
CO2: 26 mmol/L (ref 19–32)
Calcium: 9.5 mg/dL (ref 8.4–10.5)
Chloride: 103 mEq/L (ref 96–112)
Creatinine, Ser: 0.51 mg/dL (ref 0.50–1.10)
GFR calc non Af Amer: >90 mL/min (ref >90)
Glucose, Bld: 144 mg/dL — ABNORMAL HIGH (ref 70–99)
POTASSIUM: 3.8 mmol/L (ref 3.5–5.1)
SODIUM: 138 mmol/L (ref 135–145)

## 2014-08-03 LAB — CBC
HEMATOCRIT: 40.1 % (ref 36.0–46.0)
HEMOGLOBIN: 13.2 g/dL (ref 12.0–15.0)
MCH: 30.8 pg (ref 26.0–34.0)
MCHC: 32.9 g/dL (ref 30.0–36.0)
MCV: 93.7 fL (ref 78.0–100.0)
Platelets: 311 10*3/uL (ref 150–400)
RBC: 4.28 MIL/uL (ref 3.87–5.11)
RDW: 15.1 % (ref 11.5–15.5)
WBC: 11.5 10*3/uL — AB (ref 4.0–10.5)

## 2014-08-03 LAB — BRAIN NATRIURETIC PEPTIDE: B Natriuretic Peptide: 20.2 pg/mL (ref 0.0–100.0)

## 2014-08-03 LAB — GLUCOSE, CAPILLARY
GLUCOSE-CAPILLARY: 129 mg/dL — AB (ref 70–99)
GLUCOSE-CAPILLARY: 137 mg/dL — AB (ref 70–99)
GLUCOSE-CAPILLARY: 134 mg/dL — AB (ref 70–99)
Glucose-Capillary: 103 mg/dL — ABNORMAL HIGH (ref 70–99)
Glucose-Capillary: 118 mg/dL — ABNORMAL HIGH (ref 70–99)
Glucose-Capillary: 133 mg/dL — ABNORMAL HIGH (ref 70–99)

## 2014-08-03 MED ORDER — ACETAMINOPHEN 325 MG PO TABS
650.0000 mg | ORAL_TABLET | Freq: Four times a day (QID) | ORAL | Status: DC | PRN
Start: 2014-08-03 — End: 2014-08-08
  Administered 2014-08-03 – 2014-08-05 (×7): 650 mg
  Filled 2014-08-03 (×7): qty 2

## 2014-08-03 MED ORDER — PRO-STAT SUGAR FREE PO LIQD
30.0000 mL | Freq: Three times a day (TID) | ORAL | Status: DC
  Administered 2014-08-03 – 2014-08-05 (×6): 30 mL
  Filled 2014-08-03 (×9): qty 30

## 2014-08-03 MED ORDER — IOHEXOL 350 MG/ML SOLN
80.0000 mL | Freq: Once | INTRAVENOUS | Status: AC | PRN
  Administered 2014-08-03: 80 mL via INTRAVENOUS
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
## 2014-08-04 ENCOUNTER — Inpatient Hospital Stay (HOSPITAL_COMMUNITY): Payer: No Typology Code available for payment source

## 2014-08-04 DIAGNOSIS — R509 Fever, unspecified: Secondary | ICD-10-CM

## 2014-08-04 DIAGNOSIS — B3749 Other urogenital candidiasis: Secondary | ICD-10-CM

## 2014-08-04 DIAGNOSIS — J13 Pneumonia due to Streptococcus pneumoniae: Secondary | ICD-10-CM

## 2014-08-04 LAB — BASIC METABOLIC PANEL
Anion gap: 10 (ref 5–15)
BUN: 19 mg/dL (ref 6–23)
CALCIUM: 9.4 mg/dL (ref 8.4–10.5)
CO2: 24 mmol/L (ref 19–32)
Chloride: 107 mEq/L (ref 96–112)
Creatinine, Ser: 0.53 mg/dL (ref 0.50–1.10)
GLUCOSE: 161 mg/dL — AB (ref 70–99)
POTASSIUM: 3.6 mmol/L (ref 3.5–5.1)
SODIUM: 141 mmol/L (ref 135–145)

## 2014-08-04 LAB — CBC
HCT: 41.4 % (ref 36.0–46.0)
HEMOGLOBIN: 13.2 g/dL (ref 12.0–15.0)
MCH: 30.6 pg (ref 26.0–34.0)
MCHC: 31.9 g/dL (ref 30.0–36.0)
MCV: 95.8 fL (ref 78.0–100.0)
PLATELETS: 300 10*3/uL (ref 150–400)
RBC: 4.32 MIL/uL (ref 3.87–5.11)
RDW: 15.2 % (ref 11.5–15.5)
WBC: 9.7 10*3/uL (ref 4.0–10.5)

## 2014-08-04 LAB — URINALYSIS, ROUTINE W REFLEX MICROSCOPIC
BILIRUBIN URINE: NEGATIVE
GLUCOSE, UA: NEGATIVE mg/dL
Hgb urine dipstick: NEGATIVE
Ketones, ur: NEGATIVE mg/dL
Nitrite: NEGATIVE
PH: 6 (ref 5.0–8.0)
Protein, ur: 30 mg/dL — AB
Specific Gravity, Urine: 1.038 — ABNORMAL HIGH (ref 1.005–1.030)
Urobilinogen, UA: 0.2 mg/dL (ref 0.0–1.0)

## 2014-08-04 LAB — URINE MICROSCOPIC-ADD ON

## 2014-08-04 LAB — CLOSTRIDIUM DIFFICILE BY PCR: Toxigenic C. Difficile by PCR: NEGATIVE

## 2014-08-04 LAB — GLUCOSE, CAPILLARY
Glucose-Capillary: 131 mg/dL — ABNORMAL HIGH (ref 70–99)
Glucose-Capillary: 134 mg/dL — ABNORMAL HIGH (ref 70–99)
Glucose-Capillary: 136 mg/dL — ABNORMAL HIGH (ref 70–99)
Glucose-Capillary: 143 mg/dL — ABNORMAL HIGH (ref 70–99)
Glucose-Capillary: 153 mg/dL — ABNORMAL HIGH (ref 70–99)
Glucose-Capillary: 154 mg/dL — ABNORMAL HIGH (ref 70–99)

## 2014-08-04 MED ORDER — FLUCONAZOLE IN SODIUM CHLORIDE 200-0.9 MG/100ML-% IV SOLN
200.0000 mg | INTRAVENOUS | Status: DC
  Administered 2014-08-04 – 2014-08-07 (×4): 200 mg via INTRAVENOUS
  Filled 2014-08-04 (×4): qty 100

## 2014-08-04 NOTE — Progress Notes (Signed)
ANTIFUNGAL CONSULT NOTE - Follow-up  Pharmacy Consult for Fluconazole Indication: UA +yeast  No Known Allergies  Patient Measurements: Height: 5\' 5"  (165.1 cm) Weight: 180 lb 1.9 oz (81.7 kg) IBW/kg (Calculated) : 57   Vital Signs: Temp: 102.7 F (39.3 C) (12/27 1229) Temp Source: Axillary (12/27 1229) BP: 107/58 mmHg (12/27 1229) Pulse Rate: 113 (12/27 1229)  Assessment: 53 yof presented to the hospital with hypoxis and fever after a recent hospital discharge. Started on empiric abx day #6 for treatment of possible pneumonia. Strep pneumo positive so patient was transitioned to monotherapy with CTX on 12/25. UA collected on 12/27 positive for many yeast to start treatment with fluconazole. Patient remains febrile with tmax 102.8, WBC have trended down to 9.7. SCr stable at 0.53 (CrCl ~86 ml/min), ALT slightly elevated on 12/23.   Cefepime 12/22>>12/25 Vanc 12/22>>12/25 CTX 12/25>> Fluconazole 12/27>>  12/23 BCx2>>NGTD 12/23 Sputum - NPOF  12/27 cdiff PCR>>  Goal of Therapy:  Resolution of infection  Plan:  - Initiate fluconazole 200 mg IV q24h - Continue CTX 1 gm IV q24h - Monitor renal and liver function, fever curve, C&S  Shima Compere K. Bonnye FavaNicolsen, PharmD Clinical Pharmacist - Resident Pager: [phone] Pharmacy: [phone] 08/04/2014 2:10 PM
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
## 2014-08-05 DIAGNOSIS — B3749 Other urogenital candidiasis: Secondary | ICD-10-CM | POA: Insufficient documentation

## 2014-08-05 DIAGNOSIS — J690 Pneumonitis due to inhalation of food and vomit: Secondary | ICD-10-CM

## 2014-08-05 DIAGNOSIS — L8990 Pressure ulcer of unspecified site, unspecified stage: Secondary | ICD-10-CM

## 2014-08-05 LAB — GLUCOSE, CAPILLARY
GLUCOSE-CAPILLARY: 144 mg/dL — AB (ref 70–99)
GLUCOSE-CAPILLARY: 134 mg/dL — AB (ref 70–99)
GLUCOSE-CAPILLARY: 133 mg/dL — AB (ref 70–99)
GLUCOSE-CAPILLARY: 129 mg/dL — AB (ref 70–99)
Glucose-Capillary: 122 mg/dL — ABNORMAL HIGH (ref 70–99)
Glucose-Capillary: 151 mg/dL — ABNORMAL HIGH (ref 70–99)

## 2014-08-05 MED ORDER — JEVITY 1.2 CAL PO LIQD
1000.0000 mL | ORAL | Status: DC
  Administered 2014-08-06: 1000 mL
  Filled 2014-08-05 (×7): qty 1000

## 2014-08-05 MED ORDER — PRO-STAT SUGAR FREE PO LIQD
30.0000 mL | Freq: Two times a day (BID) | ORAL | Status: DC
  Administered 2014-08-05 – 2014-08-07 (×5): 30 mL
  Filled 2014-08-05 (×6): qty 30

## 2014-08-05 MED ORDER — ALBUTEROL SULFATE (2.5 MG/3ML) 0.083% IN NEBU
INHALATION_SOLUTION | RESPIRATORY_TRACT | Status: AC
  Filled 2014-08-05: qty 3

## 2014-08-05 MED ORDER — ACETYLCYSTEINE 20 % IN SOLN
4.0000 mL | Freq: Two times a day (BID) | RESPIRATORY_TRACT | Status: AC
  Administered 2014-08-05 – 2014-08-06 (×3): 4 mL via RESPIRATORY_TRACT
  Filled 2014-08-05 (×5): qty 4

## 2014-08-05 MED ORDER — ALBUTEROL SULFATE (2.5 MG/3ML) 0.083% IN NEBU
2.5000 mg | INHALATION_SOLUTION | Freq: Two times a day (BID) | RESPIRATORY_TRACT | Status: DC
  Administered 2014-08-05 – 2014-08-07 (×6): 2.5 mg via RESPIRATORY_TRACT
  Filled 2014-08-05 (×6): qty 3
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
## 2014-08-05 NOTE — Care Management Note (Signed)
    Page 1 of 1   08/05/2014     11:04:30 AM CARE MANAGEMENT NOTE 08/05/2014  Patient:  [name] [name]   Account Number:  [id number]  Date Initiated:  08/05/2014  Documentation initiated by:  [name] CreamerWELL,[name]  Subjective/Objective Assessment:   adm w pneumonia     Action/Plan:   lives w fam but had been at [facility] per chart   Anticipated DC Date:     Anticipated DC Plan:    In-house referral  Clinical Social Worker      DC Associate Professorlanning Services  CM consult      [facility]   Choice offered to / List presented to:             Status of service:   Medicare Important Message given?   (If response is "NO", the following Medicare IM given date fields will be blank) Date Medicare IM given:   Medicare IM given by:   Date Additional Medicare IM given:   Additional Medicare IM given by:    Discharge Disposition:    Per UR Regulation:    If discussed at Long Length of Stay Meetings, dates discussed:    Comments:  12/28 1103a [name] [name] Oak rn,bsn have req ltac consult [facility]. order to get ltac eval. will see if pt elidg or not.

## 2014-08-06 ENCOUNTER — Inpatient Hospital Stay (HOSPITAL_COMMUNITY): Payer: No Typology Code available for payment source

## 2014-08-06 ENCOUNTER — Encounter (HOSPITAL_COMMUNITY): Payer: Self-pay | Admitting: *Deleted

## 2014-08-06 DIAGNOSIS — S0292XA Unspecified fracture of facial bones, initial encounter for closed fracture: Secondary | ICD-10-CM

## 2014-08-06 LAB — CULTURE, BLOOD (ROUTINE X 2)
CULTURE: NO GROWTH
Culture: NO GROWTH

## 2014-08-06 LAB — GLUCOSE, CAPILLARY
GLUCOSE-CAPILLARY: 111 mg/dL — AB (ref 70–99)
Glucose-Capillary: 132 mg/dL — ABNORMAL HIGH (ref 70–99)
Glucose-Capillary: 147 mg/dL — ABNORMAL HIGH (ref 70–99)
Glucose-Capillary: 125 mg/dL — ABNORMAL HIGH (ref 70–99)
Glucose-Capillary: 133 mg/dL — ABNORMAL HIGH (ref 70–99)
Glucose-Capillary: 120 mg/dL — ABNORMAL HIGH (ref 70–99)

## 2014-08-06 MED ORDER — HEPARIN SODIUM (PORCINE) 5000 UNIT/ML IJ SOLN
5000.0000 [IU] | Freq: Three times a day (TID) | INTRAMUSCULAR | Status: AC
Start: 2014-08-06 — End: ?

## 2014-08-06 MED ORDER — JEVITY 1.2 CAL PO LIQD: 1000.0000 mL | ORAL | Status: AC

## 2014-08-06 MED ORDER — CETYLPYRIDINIUM CHLORIDE 0.05 % MT LIQD: 7.0000 mL | Freq: Two times a day (BID) | OROMUCOSAL | Status: AC

## 2014-08-06 MED ORDER — ALBUTEROL SULFATE (2.5 MG/3ML) 0.083% IN NEBU: 2.5000 mg | INHALATION_SOLUTION | Freq: Two times a day (BID) | RESPIRATORY_TRACT | Status: AC

## 2014-08-06 MED ORDER — FLUCONAZOLE IN SODIUM CHLORIDE 200-0.9 MG/100ML-% IV SOLN: 200.0000 mg | INTRAVENOUS | Status: AC

## 2014-08-06 MED ORDER — PIPERACILLIN-TAZOBACTAM 3.375 G IVPB: 3.3750 g | Freq: Three times a day (TID) | INTRAVENOUS | Status: AC

## 2014-08-06 MED ORDER — VANCOMYCIN HCL 10 G IV SOLR
1250.0000 mg | Freq: Three times a day (TID) | INTRAVENOUS | Status: DC
  Administered 2014-08-06: 1250 mg via INTRAVENOUS
  Filled 2014-08-06 (×2): qty 1250

## 2014-08-06 MED ORDER — ACETYLCYSTEINE 20 % IN SOLN: 4.0000 mL | Freq: Two times a day (BID) | RESPIRATORY_TRACT | Status: AC

## 2014-08-06 MED ORDER — CHLORHEXIDINE GLUCONATE 0.12 % MT SOLN: 15.0000 mL | Freq: Two times a day (BID) | OROMUCOSAL | Status: AC

## 2014-08-06 MED ORDER — VANCOMYCIN HCL IN DEXTROSE 1-5 GM/200ML-% IV SOLN
1000.0000 mg | Freq: Three times a day (TID) | INTRAVENOUS | Status: DC
  Filled 2014-08-06 (×2): qty 200

## 2014-08-06 MED ORDER — HYDRALAZINE HCL 20 MG/ML IJ SOLN: 5.0000 mg | INTRAMUSCULAR | Status: AC | PRN

## 2014-08-06 MED ORDER — PRO-STAT SUGAR FREE PO LIQD: 30.0000 mL | Freq: Two times a day (BID) | ORAL | Status: AC

## 2014-08-06 MED ORDER — PIPERACILLIN-TAZOBACTAM 3.375 G IVPB
3.3750 g | Freq: Three times a day (TID) | INTRAVENOUS | Status: DC
  Administered 2014-08-06 – 2014-08-07 (×5): 3.375 g via INTRAVENOUS
  Filled 2014-08-06 (×6): qty 50
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
## 2014-08-06 NOTE — Progress Notes (Signed)
ANTIBIOTIC CONSULT NOTE   Pharmacy Consult for Vancomycin and Zosyn and Diflucan Indication: PNA and yeast UTI  No Known Allergies  Patient Measurements: Height: 5\' 5"  (165.1 cm) Weight: 180 lb 1.9 oz (81.7 kg) IBW/kg (Calculated) : 57   Vital Signs: Temp: 100.5 F (38.1 C) (12/29 0810) Temp Source: Axillary (12/29 0810) BP: 115/57 mmHg (12/29 0950) Pulse Rate: 119 (12/29 0950)  Assessment: 53 yof on Abx Day #8 for HCAP -- narrowed to Rocephin on 12/25 with strep pneumo AG positive. To rebroaden to Vancomycin and Zosyn 12/29 due to continuing fevers. Also on Diflucan Day #3 for yeast UTI. Tm 102.1, Tc 100.5. 12/26 CT shows multilobar PNA. 12/29 CXR shows infilitrates in lower lobes (no worsening). WBC down to nl.  **Pt with recent Vanc trough 10.5 on 12/9 with same CrCl and dose of 1gm IV q8h**  Cefepime 12/22>>12/25 Vanc 12/22>>12/25; restart 12/29 Zosyn 12/29>> CTX 12/25>>12/29 Fluconazole 12/27>>  12/23 Strep pneumo AG >>positive 12/23 BCx2>>Neg 12/23 Sputum - Neg 12/27 cdiff PCR>>neg 12/27 stool>> 12/29 Bld x2>>  Renal: SCr stable, lytes ok. UOP 0.6 ml/kg/hr.  Goal of Therapy:  Vancomycin trough level 15-20 mcg/ml  Plan:  - Fluconazole 200 mg IV q24h  - Zosyn 3.375gm IV q8h - each dose over 4 hours - Vancomycin 1250 gm IV q8h - BMET in a.m. - F/u renal fxn, C&S, clinical status, fever curve - F/u vanc trough at [facility]  Christoper Fabianaron Seniyah Esker, PharmD, BCPS Clinical pharmacist, pager [phone]  08/06/2014 9:55 AM
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
## 2014-08-07 DIAGNOSIS — R531 Weakness: Secondary | ICD-10-CM

## 2014-08-07 DIAGNOSIS — Z515 Encounter for palliative care: Secondary | ICD-10-CM

## 2014-08-07 DIAGNOSIS — G40209 Localization-related (focal) (partial) symptomatic epilepsy and epileptic syndromes with complex partial seizures, not intractable, without status epilepticus: Secondary | ICD-10-CM

## 2014-08-07 LAB — STOOL CULTURE

## 2014-08-07 LAB — BASIC METABOLIC PANEL
Anion gap: 9 (ref 5–15)
BUN: 18 mg/dL (ref 6–23)
CALCIUM: 9.4 mg/dL (ref 8.4–10.5)
CO2: 28 mmol/L (ref 19–32)
CREATININE: 0.5 mg/dL (ref 0.50–1.10)
Chloride: 109 mEq/L (ref 96–112)
GFR calc Af Amer: >90 mL/min (ref >90)
GFR calc non Af Amer: >90 mL/min (ref >90)
GLUCOSE: 136 mg/dL — AB (ref 70–99)
Potassium: 5.4 mmol/L — ABNORMAL HIGH (ref 3.5–5.1)
Sodium: 146 mmol/L — ABNORMAL HIGH (ref 135–145)

## 2014-08-07 LAB — GLUCOSE, CAPILLARY
GLUCOSE-CAPILLARY: 146 mg/dL — AB (ref 70–99)
GLUCOSE-CAPILLARY: 139 mg/dL — AB (ref 70–99)
Glucose-Capillary: 104 mg/dL — ABNORMAL HIGH (ref 70–99)
Glucose-Capillary: 126 mg/dL — ABNORMAL HIGH (ref 70–99)
Glucose-Capillary: 129 mg/dL — ABNORMAL HIGH (ref 70–99)
Glucose-Capillary: 135 mg/dL — ABNORMAL HIGH (ref 70–99)

## 2014-08-07 MED ORDER — SODIUM POLYSTYRENE SULFONATE 15 GM/60ML PO SUSP
15.0000 g | Freq: Once | ORAL | Status: AC
  Administered 2014-08-07: 15 g via ORAL
  Filled 2014-08-07: qty 60

## 2014-08-07 NOTE — Clinical Social Work Note (Deleted)
9:30am Patient does not have official bed offers at this time- several facilities are considering pending nursing eval- [facility] (who are considering taking patient) to evaluate and inform CSW of decision.  8:30am CSW submitted clinicals to [facility] for SNF approval.  CSW called patients daughter to offer choice.  Daughter preferred that patients sister make the decision- CSW offered choice to patients sister [name]([phone]- [phone]) who did not know anything about the facilities.  CSW will continue to follow and talk to patients daughter again to start search process.  Merlyn LotJenna Holoman, LCSWA Clinical Social Worker [phone]
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
## 2014-08-07 NOTE — Progress Notes (Signed)
Patient's flexiseal came out.  Still with loose stools.   Pegtube clamped and  trach care done.   Room assignment received and report called to receiving nurse for room [facility].  Patient's sister, [name] [name] is at bedside at this time.
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
## 2014-08-07 NOTE — Clinical Social Work Note (Addendum)
12:00pm RNCM spoke with patients daughter who stated that she would like LTACH for the patient.  Patients sister also wants to pursue LTACH at this time.  Family [facility] in [facility]  10:45am CSW provided parole officer with a signed letter stating that patient is unable to attend parole meetings due to medical condition.  Patients sister would like for CSW to speak with patient daughter and convince the daughter to hand over decision making to the sister-  Patients daughter already informed CSW that she does not want to make decisions because she has only known patient for a year.  CSW will continue to follow.  Merlyn LotJenna Holoman, LCSWA Clinical Social Worker [phone]
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
## 2014-08-07 NOTE — Progress Notes (Signed)
Pt discharged to [facility] via [facility]. Pt vitals stable.

## 2014-08-07 NOTE — Progress Notes (Signed)
[id number].  Paged Henrietta Mayo, Case manager, to verify if patient is actually leaving today for [facility].  She called me back and stated that she will text Santa GeneraAndrea Pike, [facility] Clinical Liaison.  [phone].  Sue Lushndrea called me and stated that [facility] will call me for room assignment and accepting physician.  Awaiting for [facility] Staff to call me back.

## 2014-08-07 NOTE — Progress Notes (Signed)
Spoke with [name] [name], patient's sister and [place]ation [place], [name] [name] with phone [phone].  They are here for the conference.  Daughter, [name] [name], was called and she stated that she can not attend the conference because she got a sick child.  She wanted to attend the conference per speaker phone.  CSW was notified of this matter.

## 2014-08-07 NOTE — Progress Notes (Signed)
[name], Liaison Care Manager, came and stated that patient got accepted at [facility].  Dr. Sharl MaLama came to the unit and is aware of this acceptance.  Ms. Ival BibleBetty [name], pt's sister, was called and notified her of this discharge plan.  She stated if I called [name] [name], patient's daughter.  I informed her that I tried to call her and left a message but haven't got any return call from her.  I informed her that per Palliative Care Nurse, she spoke with [name] [name] and she told her [name] [name]([name] Parker, NP) that Ms. [name] RhodesBetty will make all the decision.

## 2014-08-08 MED FILL — Ondansetron HCl Inj 4 MG/2ML (2 MG/ML): INTRAMUSCULAR | Qty: 2 | Status: AC

## 2014-08-12 LAB — CULTURE, BLOOD (ROUTINE X 2)
CULTURE: NO GROWTH
Culture: NO GROWTH

## 2014-08-26 ENCOUNTER — Other Ambulatory Visit (HOSPITAL_COMMUNITY): Payer: No Typology Code available for payment source

## 2014-08-27 ENCOUNTER — Ambulatory Visit (HOSPITAL_COMMUNITY)
Admission: RE | Admit: 2014-08-27 | Discharge: 2014-08-27 | Disposition: A | Discharge status: Home / Self Care | Payer: No Typology Code available for payment source | Source: Ambulatory Visit | Attending: Internal Medicine | Admitting: Internal Medicine

## 2014-08-27 NOTE — Progress Notes (Signed)
EEG Completed; Results Pending  

## 2014-08-29 DIAGNOSIS — G9340 Encephalopathy, unspecified: Secondary | ICD-10-CM

## 2014-08-29 NOTE — Procedures (Signed)
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   Sedation: fentanyl prn  Technique: This is a 17 channel routine scalp EEG performed at the bedside with bipolar and monopolar montages arranged in accordance to the international 10/20 system of electrode placement. One channel was dedicated to EKG recording.    Background: The background consists predominantly irregular delta and theta activity with a frontal predominance. Near the end of the recording there is a posterior dominant rhythm of 8-9 Hz that is seen at times. There is occasional sharp wave activity noted in the left temporal region.  Photic stimulation and hyperventilation not performed  EEG Abnormalities: 1) Left temporal sharp waves. 2) Generalized irregular slow activity.   Clinical Interpretation: This  EEG is consistent with a region of dysfunction and potential epileptogenicity in the left temporal region. In addition, there is evidence of a generalized non-specific cerebral dysfunction(encephalopathy).  There was no seizure recorded on this study.    Elspeth Choeter Ayda Tancredi, DO [facility] [phone]  If 7pm- 7am, please page neurology on call as listed in AMION.   If 7pm- 7am, please page neurology on call as listed in AMION.

## 2014-09-20 ENCOUNTER — Ambulatory Visit (HOSPITAL_COMMUNITY)
Admission: RE | Admit: 2014-09-20 | Discharge: 2014-09-20 | Disposition: A | Discharge status: Home / Self Care | Payer: No Typology Code available for payment source | Source: Ambulatory Visit | Attending: Internal Medicine | Admitting: Internal Medicine

## 2014-09-20 DIAGNOSIS — G40209 Localization-related (focal) (partial) symptomatic epilepsy and epileptic syndromes with complex partial seizures, not intractable, without status epilepticus: Secondary | ICD-10-CM

## 2014-09-21 NOTE — Procedures (Signed)
ELECTROENCEPHALOGRAM REPORT   Patient: [name] LauberRhonda [name]       Room #: [facility] 418 EEG No. ID: [id number] Age: 54 y.o.        Sex: female Referring Physician: Vasrieddy Report Date:  09/20/2014        Interpreting Physician: Thana FarrEYNOLDS, Elayah Klooster  History: [name] LauberRhonda [name] is an 54 y.o. 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   Medications:  Dilantin, Phenobarbital, Midazolam, Keppra, Senna, Tylenol, Pantoprazole, Heparin, Hydralazine, Chlorheptadine  Conditions of Recording:  This is a 16 channel EEG carried out with the patient in the asleep state.  Description:  The background activity is slow with polymorphic delta activity dominating the rhythm in the right temporal region.  The amplitude is decreased in this region as well compared to the other areas.  Throughout the other regions of the brain there is also the presence of some theta activity as well.  This background rhythm is poorly organized.  Normal sleep transients are noted of symmetrical sleep spindles and vertex central sharp transients.   No epileptiform activity is noted.  Hyperventilation and intermittent photic stimulation were not performed.   IMPRESSION: This is an abnormal EEG secondary to focal slowing in the temporal region on the right.  This finding is suggestive of a focal disturbance that is etiologically nonspecific, but is consistent with the history of a shunt on the right. No epileptiform activity is noted.        Thana FarrLeslie Meyer Dockery, MD [facility] [phone] 09/20/2014, 7:27 PM

## 2015-01-08 DEATH — deceased

## 2015-11-09 IMAGING — CR DG CHEST 1V PORT
1 series · 1 of 1 positions shown · non-contrast
Comparison: Radiograph 07/03/2014

CLINICAL DATA: Pneumonia

EXAM:
PORTABLE CHEST - 1 VIEW

[AP]
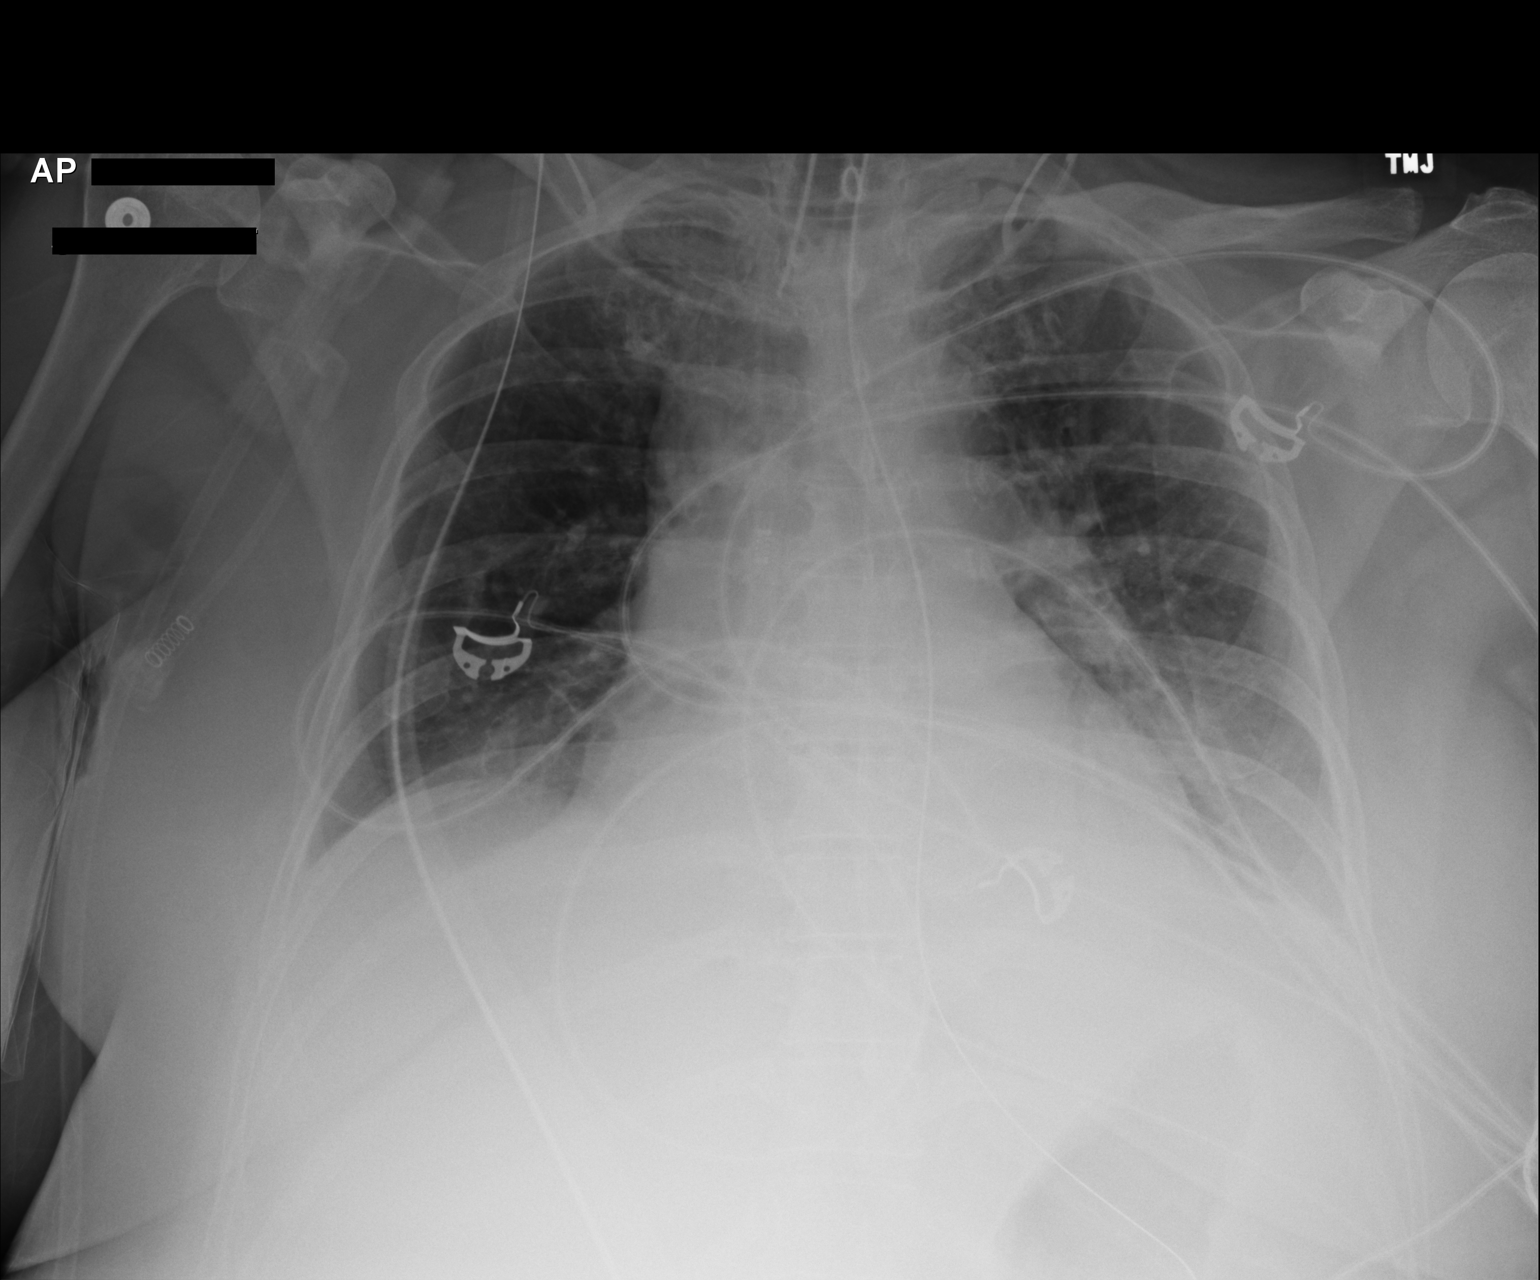

[1 of 1 positions shown; findings below may reference images not displayed]

FINDINGS: Endotracheal tube and NG tube are unchanged. Stable enlarged cardiac
silhouette. There are bilateral pleural effusions. Left basilar
atelectasis. No pneumothorax.
IMPRESSION: 1. Stable support apparatus.
2. Bilateral pleural effusions and left basilar atelectasis

## 2015-11-19 IMAGING — CR DG CHEST 1V PORT
1 series · 1 of 1 positions shown · non-contrast
Comparison: None.

CLINICAL DATA: Intubated

EXAM:
07/13/2014

[AP]
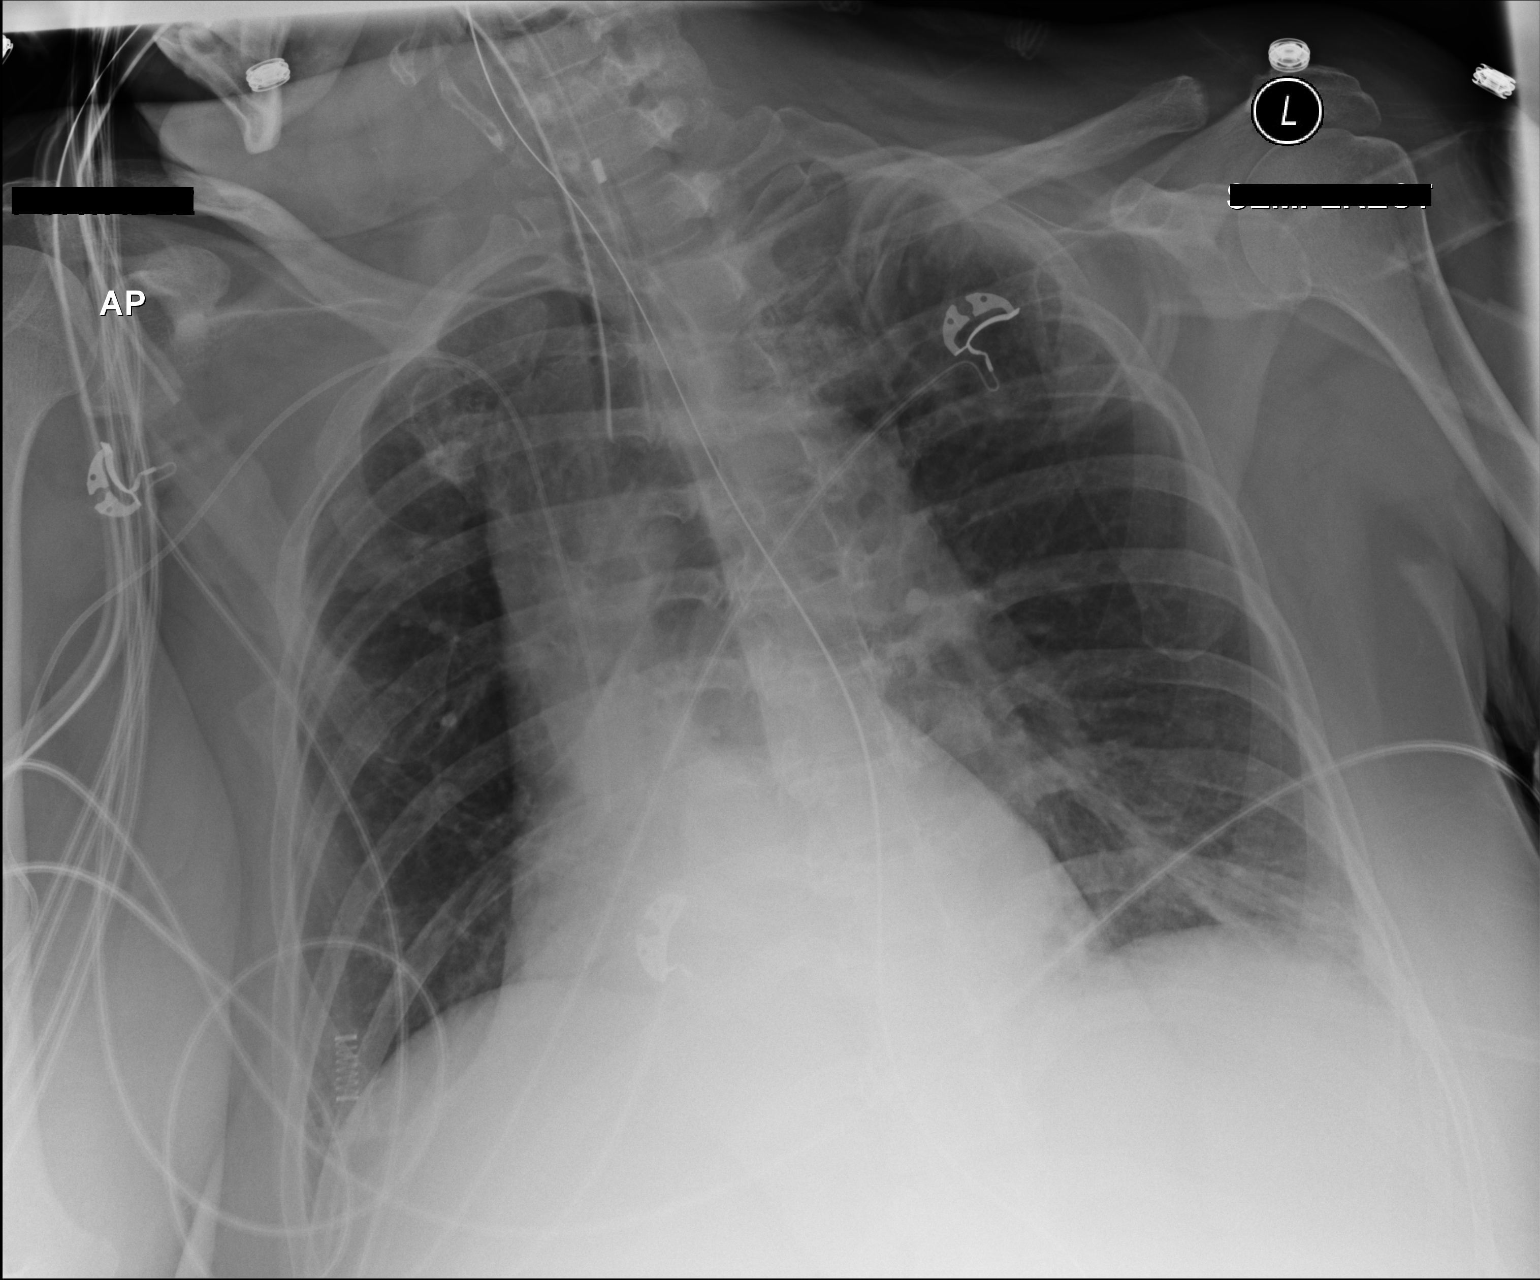

[1 of 1 positions shown; findings below may reference images not displayed]

FINDINGS: Patient is rotated to the right. Endotracheal tube remains
appropriately positioned. Nasogastric tube tip terminates below the
level of the diaphragms but is not included in the field of view.
Right-sided PICC line in place with tip over the cavoatrial
junction. Heart size is mildly enlarged without evidence for edema.
Linear left lower lobe atelectasis is identified. Trace pleural
lesions.
IMPRESSION: Support apparatus as above, endotracheal tube appropriately
positioned.

## 2015-11-20 IMAGING — CT CT HEAD W/O CM
1 series · 15 of 29 positions shown, 19 images · non-contrast
Comparison: Prior MRI and CT from 07/11/2014.

CLINICAL DATA: Followup exam for hydrocephalus.

EXAM:
CT HEAD WITHOUT CONTRAST
TECHNIQUE: Contiguous axial images were obtained from the base of the skull
through the vertex without intravenous contrast.

[Series 2: head 5.0 h30s · axial · 0.42mm/px · z∈[-141,-11]mm · 15 of 29 slices shown, 19 images]
[im 2/29  brain]
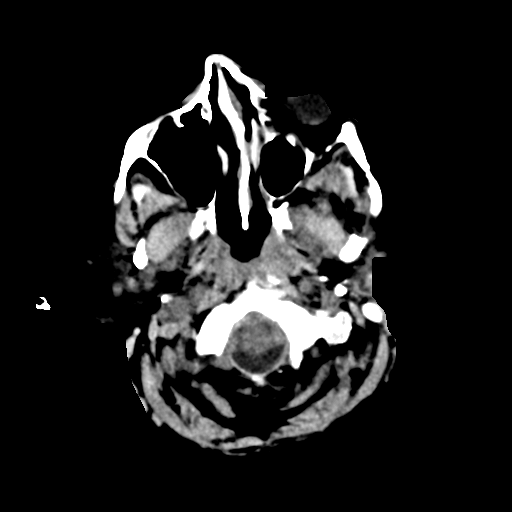
[im 2/29  bone]
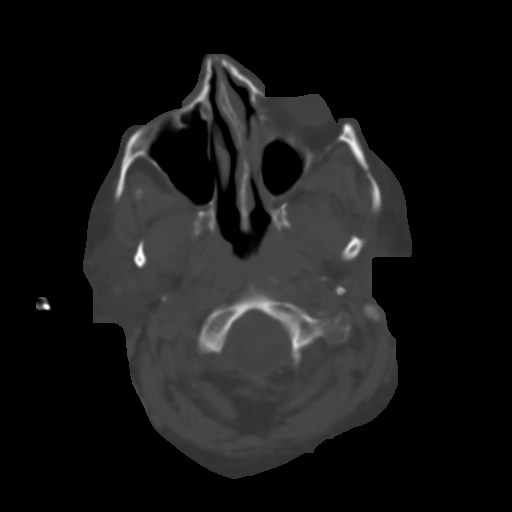
[im 4/29  brain]
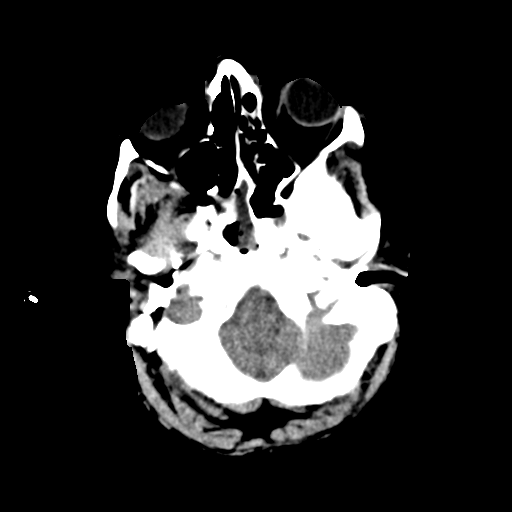
[im 6/29  brain]
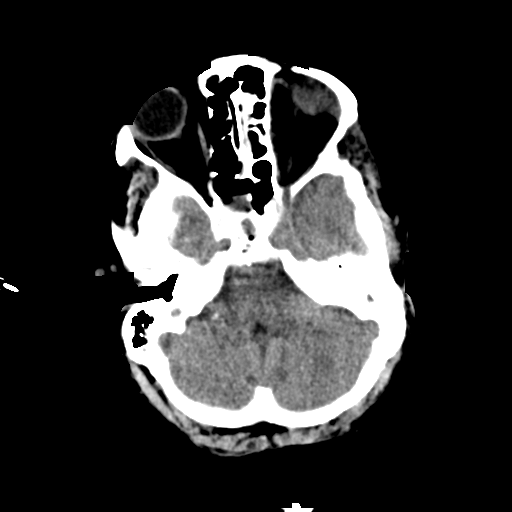
[im 8/29  brain]
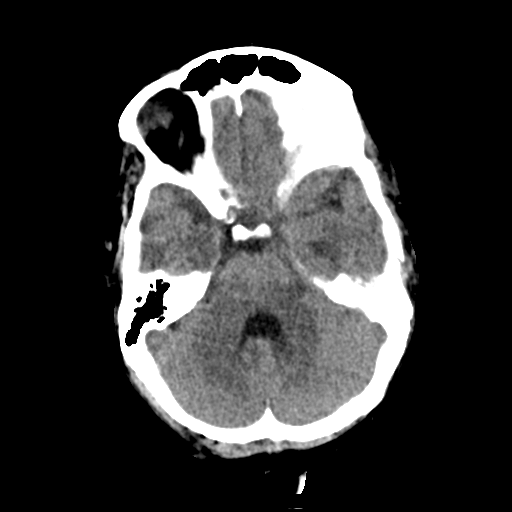
[im 10/29  brain]
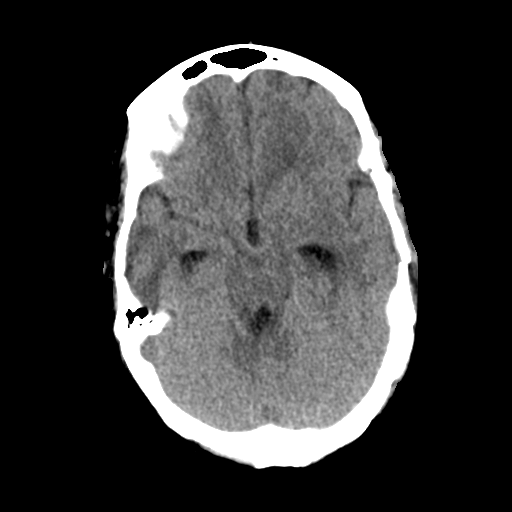
[im 10/29  bone]
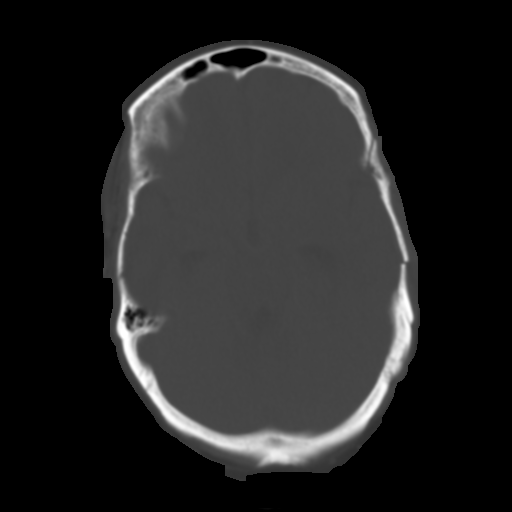
[im 11/29  brain]
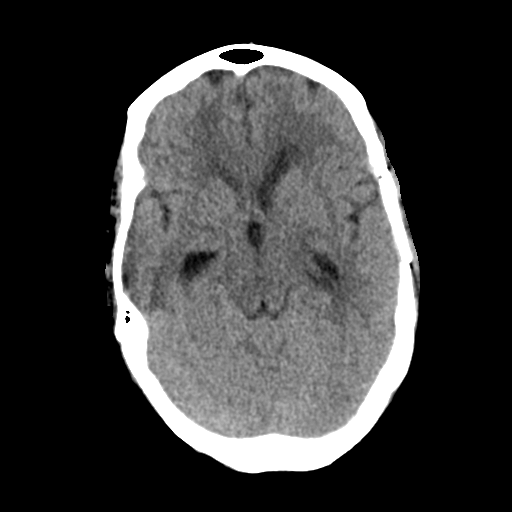
[im 13/29  brain]
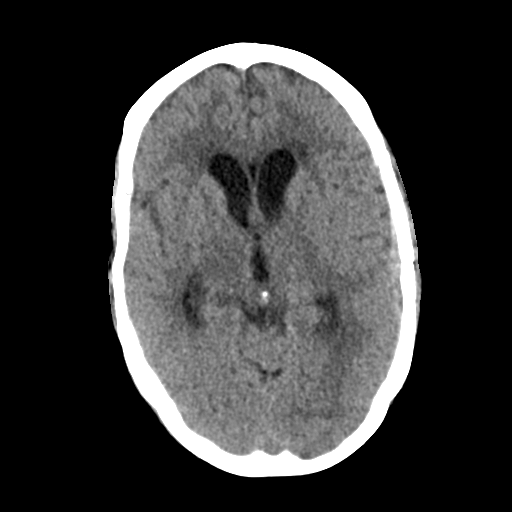
[im 15/29  brain]
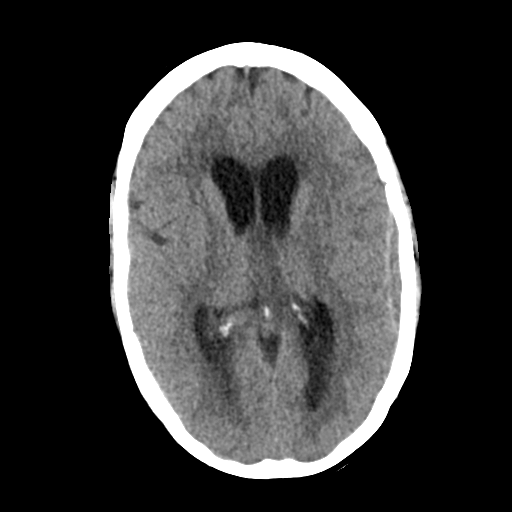
[im 17/29  brain]
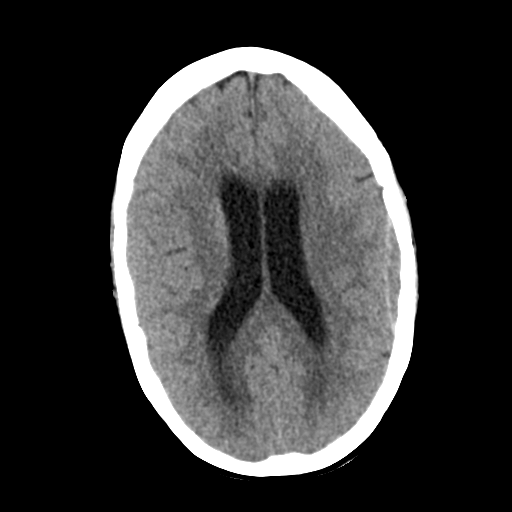
[im 17/29  bone]
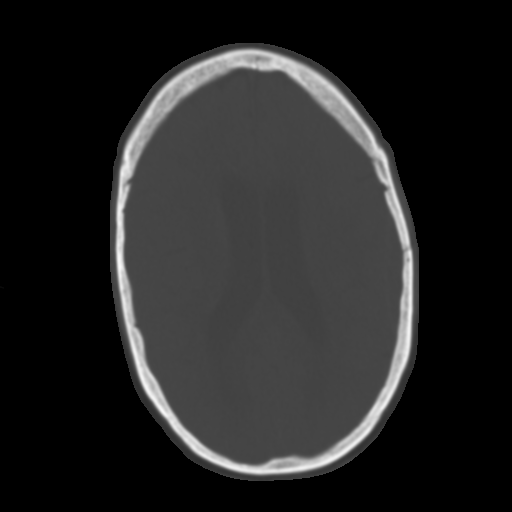
[im 19/29  brain]
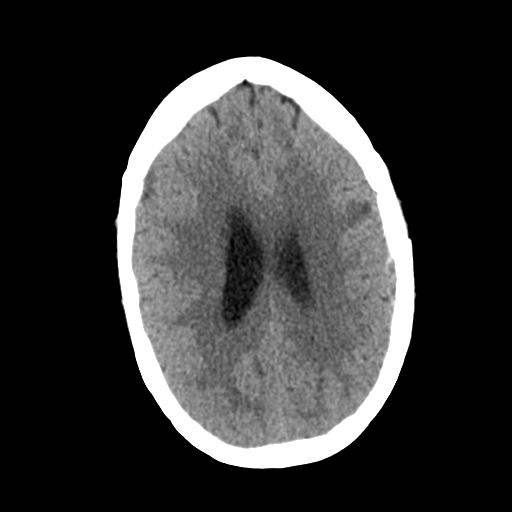
[im 20/29  brain]
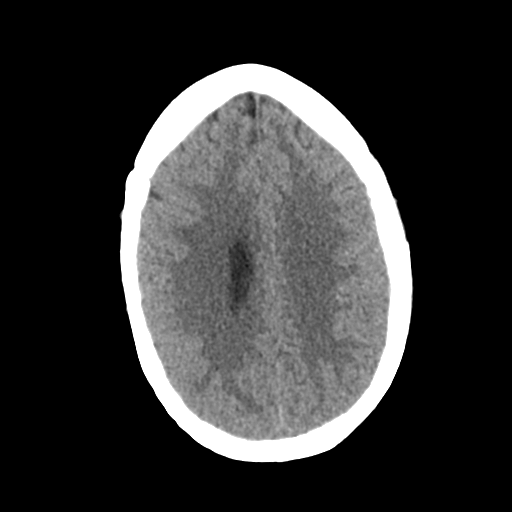
[im 22/29  brain]
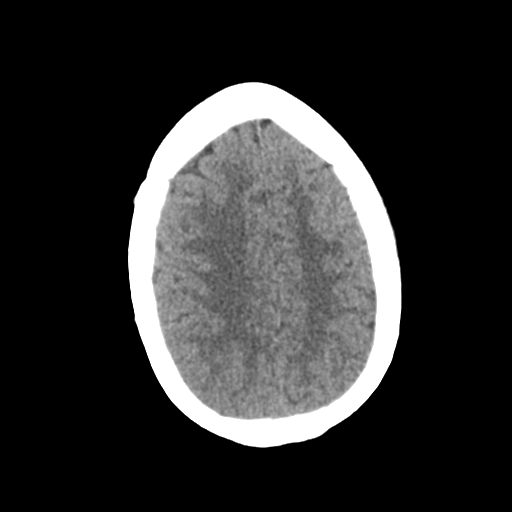
[im 24/29  brain]
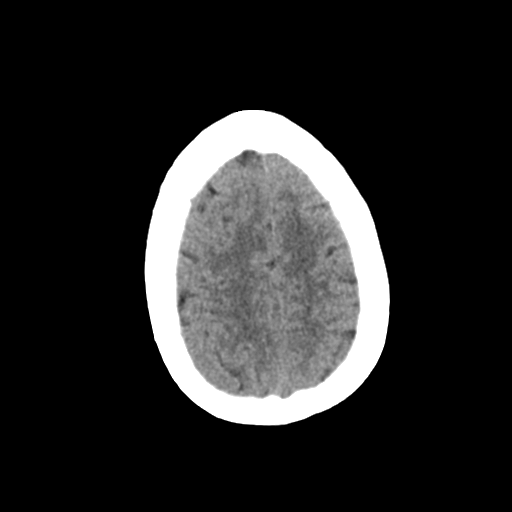
[im 24/29  bone]
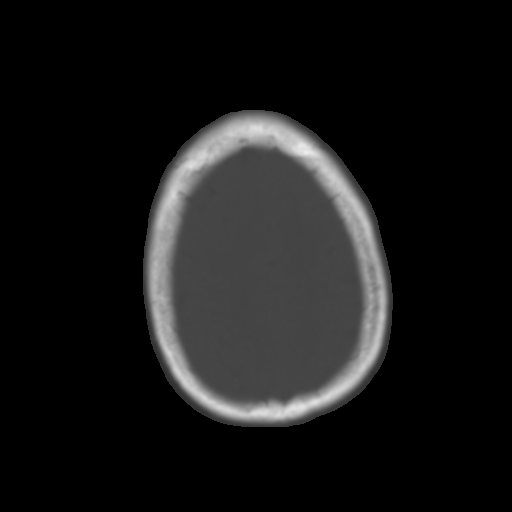
[im 26/29  brain]
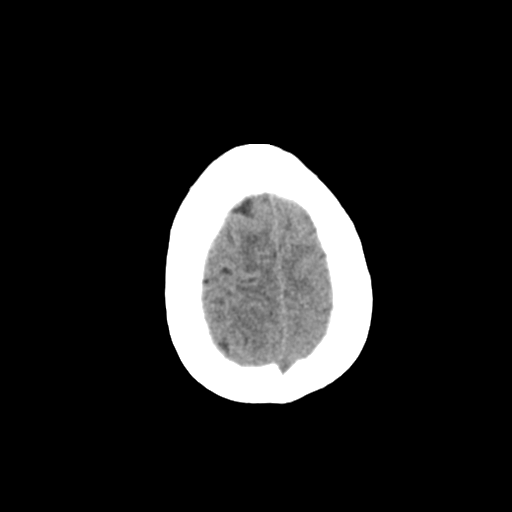
[im 28/29  brain]
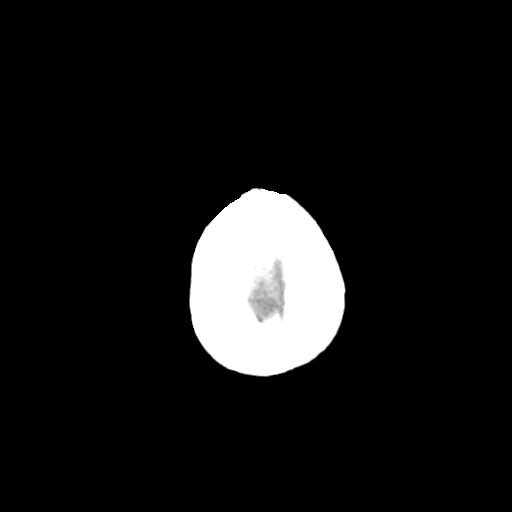

[15 of 29 positions shown; findings below may reference images not displayed]

FINDINGS: Left-sided epidural hematoma is slightly decreased in size now
measuring 5.5 mm in maximal thickness, previously 7.8 mm on
07/11/2014. Attenuation within this collection is overall similar
with no acute hemorrhage.

There has been continued interval evolution of hemorrhagic
contusions within the bilateral frontal lobes and right temporal
lobes. Associated edema about these contusions a is slightly
decreased. No new or acute hemorrhage. There is perhaps trace
residual hemorrhage along the left tentorium (series 2, image 12).
Previous identified contusion at the right inferior colliculus not
well appreciated.

No frank intraventricular hemorrhage identified. Ventricular size is
stable with biventricular volume measuring 36 mm at the level of the
frontal horns, previously 38 mm on 07/11/2014. No midline shift.

No acute large vessel territory infarct.

No new large vessel territory infarct. Left temporal bone and
orbital fractures again seen, stable. The left mastoid air cells
remain opacified with persistent opacity in the left middle ear
cavity. Small amount of opacity layering within the posterior right
mastoid air cells. Opacity within the sphenoid sinuses is similar to
prior. Nasal bone and zygomatic arch fractures also grossly stable.

No new scalp soft tissue abnormality.
IMPRESSION: 1. Stable ventricular size as compared to 07/11/2014. No worsening
hydrocephalus.
2. Slightly decreased size of left epidural hematoma now measuring
5.5 mm in maximal thickness, previously 8 mm.
3. Continued interval evolution of multi focal hemorrhagic
contusions with decreased associated edema. No new intracranial
hemorrhage.
4. Stable calvarial and facial fractures as previously described.
